# Patient Record
Sex: Male | Born: 1935 | Race: White | Hispanic: No | Smoking: Former smoker
Health system: Southern US, Community
[De-identification: ages and names within clinical notes are randomized; demographics above are authoritative.]

## PROBLEM LIST (undated history)

## (undated) DIAGNOSIS — C801 Malignant (primary) neoplasm, unspecified: Secondary | ICD-10-CM

## (undated) DIAGNOSIS — H3530 Unspecified macular degeneration: Secondary | ICD-10-CM

## (undated) DIAGNOSIS — R259 Unspecified abnormal involuntary movements: Secondary | ICD-10-CM

## (undated) DIAGNOSIS — K219 Gastro-esophageal reflux disease without esophagitis: Secondary | ICD-10-CM

## (undated) DIAGNOSIS — I10 Essential (primary) hypertension: Secondary | ICD-10-CM

## (undated) DIAGNOSIS — IMO0001 Reserved for inherently not codable concepts without codable children: Secondary | ICD-10-CM

## (undated) DIAGNOSIS — M1990 Unspecified osteoarthritis, unspecified site: Secondary | ICD-10-CM

## (undated) DIAGNOSIS — F40240 Claustrophobia: Secondary | ICD-10-CM

## (undated) DIAGNOSIS — K589 Irritable bowel syndrome without diarrhea: Secondary | ICD-10-CM

## (undated) HISTORY — DX: Unspecified osteoarthritis, unspecified site: M19.90

## (undated) HISTORY — DX: Essential (primary) hypertension: I10

## (undated) HISTORY — PX: JOINT REPLACEMENT: SHX530

## (undated) HISTORY — PX: NASAL SEPTUM SURGERY: SHX37

## (undated) HISTORY — PX: EYE SURGERY: SHX253

## (undated) HISTORY — DX: Malignant (primary) neoplasm, unspecified: C80.1

---

## 2005-06-05 ENCOUNTER — Ambulatory Visit: Payer: Self-pay | Admitting: Orthopaedic Surgery

## 2008-01-12 ENCOUNTER — Ambulatory Visit: Payer: Self-pay | Admitting: Gastroenterology

## 2008-01-26 ENCOUNTER — Ambulatory Visit: Payer: Self-pay | Admitting: Gastroenterology

## 2008-05-08 ENCOUNTER — Ambulatory Visit: Payer: Self-pay | Admitting: General Practice

## 2012-10-03 ENCOUNTER — Ambulatory Visit: Payer: Self-pay | Admitting: Oncology

## 2012-10-03 LAB — COMPREHENSIVE METABOLIC PANEL
Alkaline Phosphatase: 83 U/L (ref 50–136)
Anion Gap: 4 — ABNORMAL LOW (ref 7–16)
BUN: 28 mg/dL — ABNORMAL HIGH (ref 7–18)
Calcium, Total: 9 mg/dL (ref 8.5–10.1)
Chloride: 104 mmol/L (ref 98–107)
Co2: 30 mmol/L (ref 21–32)
Creatinine: 1.24 mg/dL (ref 0.60–1.30)
EGFR (African American): 57 — ABNORMAL LOW
EGFR (Non-African Amer.): 49 — ABNORMAL LOW
Potassium: 4.5 mmol/L (ref 3.5–5.1)
Sodium: 138 mmol/L (ref 136–145)
Total Protein: 7.3 g/dL (ref 6.4–8.2)

## 2012-10-03 LAB — CBC CANCER CENTER
Basophil #: 0 x10 3/mm (ref 0.0–0.1)
Basophil %: 1 %
Eosinophil %: 2.4 %
HCT: 39.5 % — ABNORMAL LOW (ref 40.0–52.0)
HGB: 13.9 g/dL (ref 13.0–18.0)
Lymphocyte #: 0.9 x10 3/mm — ABNORMAL LOW (ref 1.0–3.6)
Lymphocyte %: 19.8 %
MCH: 32.4 pg (ref 26.0–34.0)
MCHC: 35.3 g/dL (ref 32.0–36.0)
Monocyte #: 0.4 x10 3/mm (ref 0.2–1.0)
Monocyte %: 9.3 %
Neutrophil #: 3.1 x10 3/mm (ref 1.4–6.5)
Platelet: 178 x10 3/mm (ref 150–440)
RBC: 4.31 10*6/uL — ABNORMAL LOW (ref 4.40–5.90)
RDW: 13 % (ref 11.5–14.5)
WBC: 4.7 x10 3/mm (ref 3.8–10.6)

## 2012-10-07 ENCOUNTER — Ambulatory Visit: Payer: Self-pay | Admitting: Oncology

## 2012-10-10 ENCOUNTER — Ambulatory Visit: Payer: Self-pay | Admitting: Oncology

## 2012-11-07 ENCOUNTER — Ambulatory Visit: Payer: Self-pay | Admitting: Oncology

## 2012-11-11 LAB — CBC CANCER CENTER
Basophil #: 0 x10 3/mm (ref 0.0–0.1)
Basophil %: 0.7 %
Eosinophil #: 0.2 x10 3/mm (ref 0.0–0.7)
Lymphocyte #: 1 x10 3/mm (ref 1.0–3.6)
Lymphocyte %: 19.3 %
MCH: 31.3 pg (ref 26.0–34.0)
Monocyte #: 0.5 x10 3/mm (ref 0.2–1.0)
Monocyte %: 9.6 %
Platelet: 160 x10 3/mm (ref 150–440)
WBC: 5 x10 3/mm (ref 3.8–10.6)

## 2012-11-18 LAB — CBC CANCER CENTER
Basophil #: 0 x10 3/mm (ref 0.0–0.1)
Basophil %: 0.5 %
Eosinophil #: 0.1 x10 3/mm (ref 0.0–0.7)
HGB: 13.7 g/dL (ref 13.0–18.0)
MCH: 31.5 pg (ref 26.0–34.0)
MCV: 92 fL (ref 80–100)
Monocyte %: 9.4 %
Neutrophil %: 73.1 %
Platelet: 175 x10 3/mm (ref 150–440)
RBC: 4.37 10*6/uL — ABNORMAL LOW (ref 4.40–5.90)
RDW: 13.3 % (ref 11.5–14.5)

## 2012-12-02 LAB — CBC CANCER CENTER
Basophil #: 0 x10 3/mm (ref 0.0–0.1)
Basophil %: 0.5 %
Eosinophil %: 2.3 %
HGB: 13.7 g/dL (ref 13.0–18.0)
Lymphocyte #: 0.7 x10 3/mm — ABNORMAL LOW (ref 1.0–3.6)
MCHC: 33.9 g/dL (ref 32.0–36.0)
MCV: 92 fL (ref 80–100)
Monocyte #: 0.6 x10 3/mm (ref 0.2–1.0)
Monocyte %: 11.4 %
Neutrophil #: 3.8 x10 3/mm (ref 1.4–6.5)
Platelet: 188 x10 3/mm (ref 150–440)
RBC: 4.39 10*6/uL — ABNORMAL LOW (ref 4.40–5.90)
RDW: 12.9 % (ref 11.5–14.5)
WBC: 5.2 x10 3/mm (ref 3.8–10.6)

## 2012-12-07 ENCOUNTER — Ambulatory Visit: Payer: Self-pay | Admitting: Oncology

## 2012-12-09 LAB — CBC CANCER CENTER
Basophil #: 0 x10 3/mm (ref 0.0–0.1)
Basophil %: 0.4 %
Eosinophil #: 0.1 x10 3/mm (ref 0.0–0.7)
Lymphocyte #: 0.5 x10 3/mm — ABNORMAL LOW (ref 1.0–3.6)
MCH: 31.7 pg (ref 26.0–34.0)
MCHC: 34.5 g/dL (ref 32.0–36.0)
MCV: 92 fL (ref 80–100)
Monocyte #: 0.5 x10 3/mm (ref 0.2–1.0)
Monocyte %: 10 %
Neutrophil %: 76.4 %
Platelet: 189 x10 3/mm (ref 150–440)
RDW: 12.7 % (ref 11.5–14.5)
WBC: 4.9 x10 3/mm (ref 3.8–10.6)

## 2012-12-16 LAB — CBC CANCER CENTER
Basophil #: 0 x10 3/mm (ref 0.0–0.1)
Basophil %: 0.6 %
Eosinophil %: 3.2 %
HCT: 36.9 % — ABNORMAL LOW (ref 40.0–52.0)
Lymphocyte #: 0.4 x10 3/mm — ABNORMAL LOW (ref 1.0–3.6)
Lymphocyte %: 8.1 %
MCHC: 34.4 g/dL (ref 32.0–36.0)
MCV: 92 fL (ref 80–100)
Monocyte #: 0.5 x10 3/mm (ref 0.2–1.0)
Monocyte %: 10.8 %
RBC: 4.02 10*6/uL — ABNORMAL LOW (ref 4.40–5.90)
RDW: 12.7 % (ref 11.5–14.5)
WBC: 4.7 x10 3/mm (ref 3.8–10.6)

## 2012-12-21 ENCOUNTER — Ambulatory Visit: Payer: Self-pay | Admitting: Podiatry

## 2013-01-02 ENCOUNTER — Encounter: Payer: Self-pay | Admitting: *Deleted

## 2013-01-02 ENCOUNTER — Ambulatory Visit: Payer: Self-pay | Admitting: Podiatry

## 2013-01-07 ENCOUNTER — Ambulatory Visit: Payer: Self-pay | Admitting: Oncology

## 2013-01-09 ENCOUNTER — Ambulatory Visit (INDEPENDENT_AMBULATORY_CARE_PROVIDER_SITE_OTHER): Payer: Medicare HMO | Admitting: Podiatry

## 2013-01-09 ENCOUNTER — Encounter: Payer: Self-pay | Admitting: Podiatry

## 2013-01-09 VITALS — BP 151/77 | HR 64 | Resp 16 | Ht 76.0 in | Wt 196.0 lb

## 2013-01-09 DIAGNOSIS — M79609 Pain in unspecified limb: Secondary | ICD-10-CM

## 2013-01-09 DIAGNOSIS — B351 Tinea unguium: Secondary | ICD-10-CM

## 2013-01-09 NOTE — Progress Notes (Signed)
  Subjective:    Patient ID: Arthur Blair, male    DOB: Aug 12, 1915, 79 y.o.   MRN: [id number]  HPI    Review of Systems  Constitutional: Negative.   HENT: Negative.   Eyes: Negative.   Respiratory: Negative.   Cardiovascular: Negative.   Gastrointestinal: Negative.   Endocrine: Negative.   Genitourinary: Negative.   Allergic/Immunologic: Negative.   Neurological: Negative.   Hematological: Negative.   Psychiatric/Behavioral: Negative.        Objective:   Physical Exam        Assessment & Plan:

## 2013-01-09 NOTE — Progress Notes (Signed)
Arthur Blair presents today for routine nail debridement. States that her toe in his toenails are bothering him in the rubbing of shoes.  Objective: Vital signs are stable he is alert and oriented x3. Pulses remain palpable bilateral lower extremity. Hammertoe deformities are noted bilateral. Nails are thick yellow dystrophic clinically mycotic and painful on palpation.  Assessment: Pain in limb secondary to onychomycosis 1 through 5 bilateral.  Plan: Debridement of nails 1 through 5 bilateral is a covered service secondary to pain. Followup with him in 3 months.

## 2013-04-03 ENCOUNTER — Encounter: Payer: Self-pay | Admitting: Podiatry

## 2013-04-03 ENCOUNTER — Ambulatory Visit (INDEPENDENT_AMBULATORY_CARE_PROVIDER_SITE_OTHER): Payer: Medicare HMO | Admitting: Podiatry

## 2013-04-03 VITALS — BP 112/55 | HR 71 | Resp 16 | Ht 76.0 in | Wt 196.0 lb

## 2013-04-03 DIAGNOSIS — M79609 Pain in unspecified limb: Secondary | ICD-10-CM

## 2013-04-03 DIAGNOSIS — B351 Tinea unguium: Secondary | ICD-10-CM

## 2013-04-03 NOTE — Progress Notes (Signed)
Arthur Blair presents today with a chief complaint of painful toenails one through 5 bilateral.  Objective: Vital signs are stable he is alert and oriented x3. Nails are thick yellow dystrophic onychomycotic painful palpation as well as debridement. Pulses are palpable bilateral.  Assessment: Pain in limb secondary to onychomycosis 1 through 5 bilateral.  Plan: Debridement nails thickness and length as cover service entered pain. Followup with him in 2-3 months.

## 2013-04-17 ENCOUNTER — Ambulatory Visit: Payer: Self-pay | Admitting: Otolaryngology

## 2013-04-17 LAB — POTASSIUM: Potassium: 4.3 mmol/L (ref 3.5–5.1)

## 2013-04-19 ENCOUNTER — Ambulatory Visit: Payer: Self-pay | Admitting: Otolaryngology

## 2013-04-21 LAB — PATHOLOGY REPORT

## 2013-04-26 ENCOUNTER — Ambulatory Visit: Payer: Self-pay | Admitting: Oncology

## 2013-05-07 ENCOUNTER — Ambulatory Visit: Payer: Self-pay | Admitting: Oncology

## 2013-07-03 ENCOUNTER — Ambulatory Visit (INDEPENDENT_AMBULATORY_CARE_PROVIDER_SITE_OTHER): Payer: Medicare HMO | Admitting: Podiatry

## 2013-07-03 ENCOUNTER — Encounter: Payer: Self-pay | Admitting: Podiatry

## 2013-07-03 VITALS — BP 118/82 | HR 88 | Resp 18

## 2013-07-03 DIAGNOSIS — M722 Plantar fascial fibromatosis: Secondary | ICD-10-CM

## 2013-07-03 DIAGNOSIS — B351 Tinea unguium: Secondary | ICD-10-CM

## 2013-07-03 DIAGNOSIS — M79609 Pain in unspecified limb: Secondary | ICD-10-CM

## 2013-07-03 NOTE — Progress Notes (Signed)
He presents today with a chief complaint of painfully elongated toenails one through 5 bilateral. She's also complaining of pain to his right heel again.  Objective: Vital signs are stable he is alert 3. His nails are thick yellow dystrophic onychomycotic and painful palpation. Assessment pain on palpation medial continued tubercle of the right heel.  Assessment: Plantar fasciitis right heel pain in limb secondary to onychomycosis 1 through 5 bilateral.  Plan: Debridement of nails 1 through 5 bilateral covered service secondary to pain injection right heel with cortisone.

## 2013-09-14 ENCOUNTER — Ambulatory Visit: Payer: Self-pay | Admitting: Oncology

## 2013-10-02 ENCOUNTER — Ambulatory Visit (INDEPENDENT_AMBULATORY_CARE_PROVIDER_SITE_OTHER): Payer: Medicare HMO | Admitting: Podiatry

## 2013-10-02 ENCOUNTER — Encounter: Payer: Self-pay | Admitting: Podiatry

## 2013-10-02 DIAGNOSIS — M79676 Pain in unspecified toe(s): Secondary | ICD-10-CM

## 2013-10-02 DIAGNOSIS — B351 Tinea unguium: Secondary | ICD-10-CM

## 2013-10-02 DIAGNOSIS — M79609 Pain in unspecified limb: Secondary | ICD-10-CM

## 2013-10-02 NOTE — Progress Notes (Signed)
He presents today with a chief complaint of painful ingrown and elongated nails 1 through 5 bilateral. ° °Objective: Pulses are palpable bilateral. Nails are thick yellow dystrophic onychomycotic and painful palpation. ° °Assessment: Pain in limb secondary to onychomycosis 1 through 5 bilateral. ° °Plan: Debridement of nails 1 through 5 bilateral covered service secondary to pain. °

## 2014-01-01 ENCOUNTER — Ambulatory Visit (INDEPENDENT_AMBULATORY_CARE_PROVIDER_SITE_OTHER): Payer: Medicare HMO | Admitting: Podiatry

## 2014-01-01 ENCOUNTER — Encounter: Payer: Self-pay | Admitting: Podiatry

## 2014-01-01 DIAGNOSIS — B351 Tinea unguium: Secondary | ICD-10-CM

## 2014-01-01 DIAGNOSIS — M79676 Pain in unspecified toe(s): Secondary | ICD-10-CM

## 2014-01-01 NOTE — Progress Notes (Signed)
Presents today chief complaint of painful elongated toenails.  Objective: Pulses are palpable bilateral nails are thick, yellow dystrophic onychomycosis and painful palpation.   Assessment: Onychomycosis with pain in limb.  Plan: Treatment of nails in thickness and length as covered service secondary to pain.  

## 2014-03-30 ENCOUNTER — Ambulatory Visit: Payer: Self-pay | Admitting: Oncology

## 2014-04-02 ENCOUNTER — Ambulatory Visit: Payer: Medicare HMO | Admitting: Podiatry

## 2014-04-02 ENCOUNTER — Ambulatory Visit (INDEPENDENT_AMBULATORY_CARE_PROVIDER_SITE_OTHER): Payer: Medicare Other | Admitting: Podiatry

## 2014-04-02 DIAGNOSIS — B351 Tinea unguium: Secondary | ICD-10-CM | POA: Diagnosis not present

## 2014-04-02 DIAGNOSIS — M79676 Pain in unspecified toe(s): Secondary | ICD-10-CM

## 2014-04-02 NOTE — Progress Notes (Signed)
He presents today with a chief complaint of painful ingrown and elongated nails 1 through 5 bilateral.  Objective: Pulses are palpable bilateral. Nails are thick yellow dystrophic onychomycotic and painful palpation.  Assessment: Pain in limb secondary to onychomycosis 1 through 5 bilateral.  Plan: Debridement of nails 1 through 5 bilateral covered service secondary to pain.

## 2014-04-09 ENCOUNTER — Ambulatory Visit: Payer: Self-pay | Admitting: Oncology

## 2014-05-11 DIAGNOSIS — Z8546 Personal history of malignant neoplasm of prostate: Secondary | ICD-10-CM | POA: Insufficient documentation

## 2014-05-11 DIAGNOSIS — Z85810 Personal history of malignant neoplasm of tongue: Secondary | ICD-10-CM | POA: Insufficient documentation

## 2014-06-29 NOTE — Consult Note (Signed)
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   Referred by Dr. Oliva Bustard.  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  Pathology Report pathology report reviewed   Imaging Report pET/CT scan reviewed   Referral Report clinical notes reviewed   Planned Treatment Regimen IMRT radiation therapy   HPI   patient is a pleasant 81 year old male who presented with you believe they chronic ulcerative irritation from dentures on his left lateral tongue. He was seen oral surgeon who performed a biopsy showing chronic dysplasia of the surface epithelium of the tongue within invasive squamous cell carcinoma component which had positive margins. Based on his advanced age and overall excellent performance status he was seen by medical oncology and now referred to radiation oncology for consideration of definitive treatment. Patient has declined any further surgery. He had a PET/CT scanshowing small amount of increased hypermetabolic activity in the posterior aspect of the tongue. There is also incidental findings of nodular area in crease uptake in the thyroid bed. No evidence of hypermetabolic activity in cervical nodes was appreciated. Patient is eating well having no significant oral pain except when he comes in contact with a left oral tongue.  Past Hx:    irregular heart beat:    Hypertension:    prostate cancer:    cataract surgery with implants:    sinus cavity enlargement:    deviated septum repair:    lt hip rplacement:    tongue biopsy:   Past, Family and Social History:  Past Medical History positive   Cardiovascular atrial fibrillation; hypertension; iirregular heartbeat   Genitourinary history of prostate cancer   Past Surgical History left hip replacement, deviated septum  repaircataract surgery   Family History noncontributory   Social History positive   Social History Comments a 40-pack-year smoking history quit smoking 40 years prior no EtOH abuse history   Additional Past Medical and Surgical History seen by himself today   Allergies:   Sulfa drugs: Hives  Home Meds:  Home Medications: Medication Instructions Status  acetaminophen-HYDROcodone 325 mg-5 mg oral tablet 1 tab(s) orally every 6 hours, As Needed - for Pain Active  aspirin 81 mg oral delayed release tablet 1 tab(s) orally once a day Active  Complete A-Z Therapeutic Multiple Vitamins with Minerals oral tablet 1 tab(s) orally once a day Active  metoprolol 12.5 milligram(s) orally once a day Active  Calcium 600+D 600 mg-200 units oral tablet 1 tab(s) orally once a day Active  fluticasone nasal 50 mcg/inh nasal spray 2 spray(s) nasal once a day Active  Probiotic Formula - oral capsule 1 cap(s) orally once a day Active  traMADol 50 mg oral tablet 1 tab(s) orally every 4 hours, As Needed Active  furosemide 20 mg oral tablet 1 tab(s) orally once a day Active  magnesium oxide 500 mg oral tablet 1 tab(s) orally once a day, As Needed Active   Review of Systems:  General negative   Performance Status (ECOG) 0   Skin negative   Breast negative   Ophthalmologic negative   ENMT see HPI   Respiratory and Thorax negative   Cardiovascular see HPI   Gastrointestinal negative   Genitourinary negative   Musculoskeletal negative   Neurological negative   Psychiatric negative   Hematology/Lymphatics negative   Endocrine negative   Allergic/Immunologic  negative   Nursing Notes:  Nursing Vital Signs and Chemo Nursing Nursing Notes: *CC Vital Signs Flowsheet:   07-Aug-14 11:15  Temp Temperature 96.6  Pulse Pulse 65  Respirations Respirations 21  SBP SBP 165  DBP DBP 79  Current Weight (kg) (kg) 96  Height (cm) centimeters 94.9  BSA (m2) 1.3   Physical Exam:   General/Skin/HEENT:  General normal   Skin normal   Eyes normal   Additional PE well-developed elderly male who looks much and stated age. Oral cavity shows some slight residual scarring in the left lateral tongue no discrete mass or nodularity is noted either by inspection or palpation. Indirect mirror examination shows upper airway clear vallecula and base of tongue within normal limits. Corresponding to area of hypermetabolic activity in the soft palate do not see any evidence of lesions in that area. Neck is clear without evidence of some digastric cervical or supraclavicular adenopathy. Lungs are clear to A&P cardiac examination shows irregular rate and rhythm. Abdomen is benign.   Breasts/Resp/CV/GI/GU:  Respiratory and Thorax normal   Cardiovascular normal   Gastrointestinal normal   Genitourinary normal   MS/Neuro/Psych/Lymph:  Musculoskeletal normal   Neurological normal   Lymphatics normal   Other Results:  Radiology Results: LabUnknown:    04-Aug-14 14:48, PET/CT Scan Head/Neck CA Initial Staging  PACS Image   Nuclear Med:  PET/CT Scan Head/Neck CA Initial Staging   REASON FOR EXAM:    head and neck CA  COMMENTS:       PROCEDURE: PET - PET/CT INIT STAG HEAD/NECK CA  - Oct 10 2012  2:48PM     RESULT: The patient is undergoing initial staging of lung malignancy. The   patient has a history of prostate malignancy in 1994 treated with   radiation therapy. The patient's fasting blood glucose level was 88 mg/dL.    The patient received 12.6 mCi of F-18 labeled FDG at 12:55 p.m. Scanning   began at 2:01 p.m. with delayed imaging beginning at 2:32 p.m. A   noncontrast CT scan was performed at the same sitting for coregistration   and attenuation correction.    A small amount of increased uptake is noted along the posterior aspect of     the tongue. This exhibits maximal SUV of 3.7 with a mean of 2.1 on the   initial images and 4.2 with a mean of 2.4 on delayed  images. There is   increased uptake along the hard palate which exhibits a maximal SUV of   4.1 and a mean of 2.4 on the immediate images and 5.3 maximally with a   mean of 2.9 on delayed images.     Uptake within the soft tissues adjacent to the thyroid gland is seen   bilaterally. On the right a focus of increased uptake along the anterior   aspect of the thyroid lobe exhibits a maximal SUV of 4.7 with a mean of   2.9. On the left a focus just inferior to the lower pole of the left   thyroid lobe exhibits maximal SUV of 6 with a mean of 3.4. A small focus   of low level increased uptake associated with the medial aspect of the   left clavicle exhibits maximal SUV of 2.9 with a mean of 1.6. Within the   mediastinum no abnormal uptake is demonstrated. No abnormal hilar uptake   is demonstrated. No abnormal chest wall uptake is demonstrated.  Below the hemidiaphragms normal expected urinary tract uptake  is   demonstrated. There is a small focus of increased uptake associated with   the prosthetic left hip. On the CT images within the thorax in the   posterior costophrenic gutter there is a mixed calcified soft tissue   density mass which does not exhibit increased uptake of the   radiopharmaceutical. There is no pleural nor pericardial effusion. There   is pleural calcification on the left posteriorly. Within the abdomen   there is a large parapelvic cyst in the right kidney. There is   approximately 1.3 cm diameter hyperdense focus exophytic from the   posterior aspect of the right kidney most compatible with a hyperdense   cyst. The abdominal aorta exhibits mild failure to taper of its caliber   with mild dilation of the common iliac arteries measured approximately 2   cm in greatest dimension. There are no adrenal masses. There are punctate   calcifications in the normal sized spleen. Unopacified loops of small and   large bowel exhibit no acute abnormality.  IMPRESSION:   1. There is  a small amount of increased uptake within the posterior   aspect of the tongue.  2. Nodular areas of increased uptake in the thyroid bed but not clearly   within the thyroid gland are noted and may reflect metastatic lymph nodes.  3. There is no abnormal uptake in the mediastinum or hilarregions.  4. Below the hemidiaphragms there are no suspicious areas of increased   uptake.  5. Mildly increased uptake associated with the hard palate as well as of   the medial aspect of the left clavicle are nonspecific. Plain films of   the clavicle may be useful. Direct visualization of the hard palate also   would be useful.  6. There are findings on the CT scan as described.     Dictation Site: 1    Verified By: DAVID A. Martinique, M.D., MD   Relevent Results:   Relevant Scans and Labs PET/CT scan is reviewed   Assessment and Plan: Impression:   clinical stage I squamous cell carcinoma left lateral tongue in 81 year old male. Plan:   based on the patient's age and wishes not to go through any further surgery believe we can accomplish curative radiation therapy with IMRT treatment planning and delivery. I will plan on delivering 6400 cGy to his left lateral tongue and first echelon lymph nodes in the subdigastric and upper cervical chain using IMRT does painting technique. Risks and benefits of treatment including decreased taste, possible xerostomia, increasing dysphasia second oral mucositis skin reaction and alteration blood counts were all explained in detail to the patient. Patient seems to [place] treatment plan well. I have set him up for CT simulation next week.  I would like to take this opportunity to thank you for allowing me to continue to participate in this patient's care.  CC Referral:  cc: Dr. Emily Filbert   Electronic Signatures: Baruch Gouty, Roda Shutters (MD)  (Signed 07-Aug-14 12:14)  Authored: HPI, Diagnosis, Past Hx, PFSH, Allergies, Home Meds, ROS, Nursing Notes, Physical Exam,  Other Results, Relevent Results, Encounter Assessment and Plan, CC Referring Physician   Last Updated: 07-Aug-14 12:14 by Armstead Peaks (MD)

## 2014-06-30 NOTE — Op Note (Signed)
PATIENT NAME:  Arthur Blair, Arthur Blair MR#:  [id number] DATE OF BIRTH:  Jun 05, 1915  DATE OF PROCEDURE:  04/19/2013  PREOPERATIVE DIAGNOSES: Dysplasia of the left oral tongue with history of left oral tongue carcinoma, status post radiation therapy.   POSTOPERATIVE DIAGNOSIS: Dysplasia of the left oral tongue with history of left oral tongue carcinoma, status post radiation therapy  PROCEDURE: Left partial glossectomy (2.5 x 6 cm excision with intermediate closure).   SURGEON: Malon Kindle MD.   ANESTHESIA: General endotracheal.   INDICATIONS: This 81 year old male with history of a left oral tongue cancer, status post radiation therapy. He has a persistent ulcerative lesion. A biopsy showed significant dysplasia. 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   FINDINGS: The central lesion was almost A centimeter in size with ulceration. There were surrounding areas of leukoplakia. Getting around the entire area of mucosal abnormality resulted in a defect of 6 x 2.5 cm.   COMPLICATIONS: None.   DESCRIPTION OF PROCEDURE: After obtaining informed consent, the patient was taken to the operating room and placed in the supine position. After induction of general endotracheal anesthesia, the patient was turned 90 degrees. The head was draped with the eyes protected. A retractor was used to open the mouth. A 2-0 silk suture was passed through the tip of the tongue to be used as a retractor and the tongue retracted outwards. The area was inspected and a marking pen used to outline an area encompassing the central ulcerative lesion as well as surrounding areas of leukoplakia with a margin of normal mucosa approximately 4 to 5 mm away from any abnormal-appearing mucosa. The area was then injected with 1% lidocaine with epinephrine 1:200,000. A 15 blade was used to make the incision around the area. Scissors were then used to excise the lesion with at least a 5 to 6 mm depth in the more central aspect of the lesion  taking care to make sure no abnormal tissue was at the margins. Bleeding was controlled with the Bovie and the wound closed in layers with 4-0 Vicryl suture for the deep closure and 4-0 chromic gut suture in a running lock stitch for the mucosal closure. The patient was then returned to the anesthesiologist for awakening. He was awakened and taken to the recovery room in good condition postoperatively. Blood loss was minimal.   ____________________________ Sammuel Hines. Richardson Landry, MD psb:aw D: 04/19/2013 08:46:28 ET T: 04/19/2013 09:10:59 ET JOB#: [id number]  cc: Sammuel Hines. Richardson Landry, MD, <Dictator> [name] MD ELECTRONICALLY SIGNED 05/03/2013 10:19

## 2014-07-02 ENCOUNTER — Ambulatory Visit (INDEPENDENT_AMBULATORY_CARE_PROVIDER_SITE_OTHER): Payer: Medicare Other | Admitting: Podiatry

## 2014-07-02 DIAGNOSIS — M79676 Pain in unspecified toe(s): Secondary | ICD-10-CM

## 2014-07-02 DIAGNOSIS — B351 Tinea unguium: Secondary | ICD-10-CM

## 2014-07-02 NOTE — Progress Notes (Signed)
He presents today with a chief complaint of painful ingrown and elongated nails 1 through 5 bilateral.  Objective: Pulses are palpable bilateral. Nails are thick yellow dystrophic onychomycotic and painful palpation.  Assessment: Pain in limb secondary to onychomycosis 1 through 5 bilateral.  Plan: Debridement of nails 1 through 5 bilateral covered service secondary to pain.

## 2014-08-02 ENCOUNTER — Other Ambulatory Visit: Payer: Self-pay | Admitting: Internal Medicine

## 2014-08-02 DIAGNOSIS — R55 Syncope and collapse: Secondary | ICD-10-CM

## 2014-08-03 ENCOUNTER — Ambulatory Visit
Admission: RE | Admit: 2014-08-03 | Discharge: 2014-08-03 | Disposition: A | Discharge status: Home / Self Care | Payer: Medicare Other | Source: Ambulatory Visit | Attending: Internal Medicine | Admitting: Internal Medicine

## 2014-08-03 DIAGNOSIS — I6523 Occlusion and stenosis of bilateral carotid arteries: Secondary | ICD-10-CM | POA: Diagnosis not present

## 2014-08-03 DIAGNOSIS — R55 Syncope and collapse: Secondary | ICD-10-CM | POA: Diagnosis present

## 2014-09-05 DIAGNOSIS — I493 Ventricular premature depolarization: Secondary | ICD-10-CM | POA: Insufficient documentation

## 2014-10-29 ENCOUNTER — Ambulatory Visit (INDEPENDENT_AMBULATORY_CARE_PROVIDER_SITE_OTHER): Payer: Medicare Other | Admitting: Podiatry

## 2014-10-29 DIAGNOSIS — B351 Tinea unguium: Secondary | ICD-10-CM | POA: Diagnosis not present

## 2014-10-29 DIAGNOSIS — M79676 Pain in unspecified toe(s): Secondary | ICD-10-CM

## 2014-10-29 NOTE — Progress Notes (Signed)
He presents today with a chief complaint of painful ingrown and elongated nails 1 through 5 bilateral.  Objective: Pulses are palpable bilateral. Nails are thick yellow dystrophic onychomycotic and painful palpation. Dry xerotic skin.  Assessment: Pain in limb secondary to onychomycosis 1 through 5 bilateral. Dry xerotic skin.  Plan: Debridement of nails 1 through 5 bilateral covered service secondary to pain. Recommended over-the-counter creams such as Eucerin.  Roselind Messier DPM

## 2015-01-28 ENCOUNTER — Ambulatory Visit: Payer: Medicare Other

## 2015-01-29 ENCOUNTER — Encounter: Payer: Self-pay | Admitting: Sports Medicine

## 2015-01-29 ENCOUNTER — Ambulatory Visit (INDEPENDENT_AMBULATORY_CARE_PROVIDER_SITE_OTHER): Payer: Medicare Other | Admitting: Sports Medicine

## 2015-01-29 DIAGNOSIS — B351 Tinea unguium: Secondary | ICD-10-CM | POA: Diagnosis not present

## 2015-01-29 DIAGNOSIS — M79676 Pain in unspecified toe(s): Secondary | ICD-10-CM

## 2015-01-29 NOTE — Progress Notes (Signed)
Patient ID: Arthur Blair, male   DOB: February 16, 1916, 81 y.o.   MRN: [id number] Subjective: Arthur Blair is a 81 y.o. male patient seen today in office with complaint of painful thickened and elongated toenails; unable to trim. Patient denies history of Diabetes, Neuropathy, or Vascular disease. Patient has no other pedal complaints at this time.   There are no active problems to display for this patient.  Current Outpatient Prescriptions on File Prior to Visit  Medication Sig Dispense Refill  . aspirin 81 MG tablet Take 81 mg by mouth daily.    . fluticasone (FLONASE) 50 MCG/ACT nasal spray     . furosemide (LASIX) 20 MG tablet     . HYDROcodone-acetaminophen (NORCO/VICODIN) 5-325 MG per tablet     . magnesium gluconate (MAGONATE) 500 MG tablet Take 500 mg by mouth daily.    . metoprolol succinate (TOPROL-XL) 25 MG 24 hr tablet     . Multiple Vitamin (MULTIVITAMIN) tablet Take 1 tablet by mouth daily.    . sucralfate (CARAFATE) 1 G tablet     . traMADol (ULTRAM) 50 MG tablet Take 50 mg by mouth as needed for pain.     No current facility-administered medications on file prior to visit.   Allergies  Allergen Reactions  . Sulfa Antibiotics Nausea And Vomiting     Objective: Physical Exam  General: Well developed, nourished, no acute distress, awake, alert and oriented x 3. Rolling Walker assisted gait  Vascular: Dorsalis pedis artery 1/4 bilateral, Posterior tibial artery 1/4 bilateral, skin temperature warm to warm proximal to distal bilateral lower extremities, + varicosities, scant pedal hair present bilateral.  Neurological: Gross sensation present via light touch bilateral.   Dermatological: Skin is warm, dry, and supple bilateral, Nails 1-10 are tender, long, thick, and discolored with mild subungal debris, no webspace macerations present bilateral, no open lesions present bilateral, no callus/corns/hyperkeratotic tissue present bilateral. No signs of infection  bilateral.  Musculoskeletal: No gross boney deformities noted bilateral. Muscular strength within normal limits without pain or limitation on range of motion. No pain with calf compression bilateral.  Assessment and Plan:  Problem List Items Addressed This Visit    None    Visit Diagnoses    Dermatophytosis of nail    -  Primary    Pain of toe, unspecified laterality          -Examined patient.  -Discussed treatment options for painful mycotic nails. -Mechanically debrided and reduced mycotic nails with sterile nail nipper and dremel nail file without incident. -Recommend continue with good supportive shoes daily and ambulation with rolling walker for stability. -Patient to return in 3 months for follow up evaluation or sooner if symptoms worsen.  Landis Martins, DPM

## 2015-04-01 ENCOUNTER — Encounter
Admission: RE | Admit: 2015-04-01 | Discharge: 2015-04-01 | Disposition: A | Discharge status: Home / Self Care | Payer: Medicare Other | Source: Ambulatory Visit | Attending: Otolaryngology | Admitting: Otolaryngology

## 2015-04-01 DIAGNOSIS — M1991 Primary osteoarthritis, unspecified site: Secondary | ICD-10-CM | POA: Diagnosis not present

## 2015-04-01 DIAGNOSIS — Z9889 Other specified postprocedural states: Secondary | ICD-10-CM | POA: Diagnosis not present

## 2015-04-01 DIAGNOSIS — Z79899 Other long term (current) drug therapy: Secondary | ICD-10-CM | POA: Diagnosis not present

## 2015-04-01 DIAGNOSIS — D3702 Neoplasm of uncertain behavior of tongue: Secondary | ICD-10-CM | POA: Diagnosis present

## 2015-04-01 DIAGNOSIS — H409 Unspecified glaucoma: Secondary | ICD-10-CM | POA: Diagnosis not present

## 2015-04-01 DIAGNOSIS — Z87891 Personal history of nicotine dependence: Secondary | ICD-10-CM | POA: Diagnosis not present

## 2015-04-01 DIAGNOSIS — R1310 Dysphagia, unspecified: Secondary | ICD-10-CM | POA: Diagnosis not present

## 2015-04-01 DIAGNOSIS — Z882 Allergy status to sulfonamides status: Secondary | ICD-10-CM | POA: Diagnosis not present

## 2015-04-01 DIAGNOSIS — Z7982 Long term (current) use of aspirin: Secondary | ICD-10-CM | POA: Diagnosis not present

## 2015-04-01 DIAGNOSIS — Z96649 Presence of unspecified artificial hip joint: Secondary | ICD-10-CM | POA: Diagnosis not present

## 2015-04-01 DIAGNOSIS — M810 Age-related osteoporosis without current pathological fracture: Secondary | ICD-10-CM | POA: Diagnosis not present

## 2015-04-01 DIAGNOSIS — I10 Essential (primary) hypertension: Secondary | ICD-10-CM | POA: Diagnosis not present

## 2015-04-01 DIAGNOSIS — Z7951 Long term (current) use of inhaled steroids: Secondary | ICD-10-CM | POA: Diagnosis not present

## 2015-04-01 DIAGNOSIS — C029 Malignant neoplasm of tongue, unspecified: Secondary | ICD-10-CM | POA: Diagnosis not present

## 2015-04-01 HISTORY — DX: Gastro-esophageal reflux disease without esophagitis: K21.9

## 2015-04-01 HISTORY — DX: Unspecified macular degeneration: H35.30

## 2015-04-01 HISTORY — DX: Irritable bowel syndrome without diarrhea: K58.9

## 2015-04-01 HISTORY — DX: Reserved for inherently not codable concepts without codable children: IMO0001

## 2015-04-01 HISTORY — DX: Claustrophobia: F40.240

## 2015-04-01 HISTORY — DX: Unspecified abnormal involuntary movements: R25.9

## 2015-04-01 LAB — BASIC METABOLIC PANEL
Anion gap: 6 (ref 5–15)
BUN: 32 mg/dL — ABNORMAL HIGH (ref 6–20)
CALCIUM: 9 mg/dL (ref 8.9–10.3)
CO2: 29 mmol/L (ref 22–32)
Chloride: 104 mmol/L (ref 101–111)
Creatinine, Ser: 1.06 mg/dL (ref 0.61–1.24)
GFR, EST NON AFRICAN AMERICAN: 57 mL/min — AB (ref >60)
Glucose, Bld: 88 mg/dL (ref 65–99)
Potassium: 4.7 mmol/L (ref 3.5–5.1)
Sodium: 139 mmol/L (ref 135–145)

## 2015-04-01 LAB — CBC
HCT: 36.3 % — ABNORMAL LOW (ref 40.0–52.0)
Hemoglobin: 12.2 g/dL — ABNORMAL LOW (ref 13.0–18.0)
MCH: 29.7 pg (ref 26.0–34.0)
MCHC: 33.6 g/dL (ref 32.0–36.0)
MCV: 88.4 fL (ref 80.0–100.0)
PLATELETS: 169 10*3/uL (ref 150–440)
RBC: 4.11 MIL/uL — ABNORMAL LOW (ref 4.40–5.90)
RDW: 13.9 % (ref 11.5–14.5)
WBC: 5 10*3/uL (ref 3.8–10.6)

## 2015-04-01 NOTE — Pre-Procedure Instructions (Signed)
EKG called to Dr. Andree Elk by Janett Billow and medical clearance requested.

## 2015-04-01 NOTE — Patient Instructions (Signed)
  Your procedure is scheduled on: April 03, 2015 (Wednesday) Report to Day Surgery.([facility])Second Floor To find out your arrival time please call [phone] between 1PM - 3PM on April 02, 2015 (Tuesday).  Remember: Instructions that are not followed completely may result in serious medical risk, up to and including death, or upon the discretion of your surgeon and anesthesiologist your surgery may need to be rescheduled.    __x__ 1. Do not eat food or drink liquids after midnight. No gum chewing or hard candies.     __x__ 2. No Alcohol for 24 hours before or after surgery.   ____ 3. Bring all medications with you on the day of surgery if instructed.    __x__ 4. Notify your doctor if there is any change in your medical condition     (cold, fever, infections).     Do not wear jewelry, make-up, hairpins, clips or nail polish.  Do not wear lotions, powders, or perfumes. You may wear deodorant.  Do not shave 48 hours prior to surgery. Men may shave face and neck.  Do not bring valuables to the hospital.    [facility] is not responsible for any belongings or valuables.               Contacts, dentures or bridgework may not be worn into surgery.  Leave your suitcase in the car. After surgery it may be brought to your room.  For patients admitted to the hospital, discharge time is determined by your                treatment team.   Patients discharged the day of surgery will not be allowed to drive home.   Please read over the following fact sheets that you were given:   Surgical Site Infection Prevention   _x___ Take these medicines the morning of surgery with A SIP OF WATER:    1. Metoprolol   2.Prilosec  ____ Fleet Enema (as directed)   ____ Use CHG Soap as directed  ____ Use inhalers on the day of surgery  ____ Stop metformin 2 days prior to surgery    ____ Take 1/2 of usual insulin dose the night before surgery and none on the morning of surgery.   _x__ Stop  Coumadin/Plavix/aspirin on (STOP ASPIRIN NOW)  _x___ Stop Anti-inflammatories on (NO NSAIDS )   _x__ Stop supplements until after surgery. (STOP PROBIOTIC NOW)   ____ Bring C-Pap to the hospital.

## 2015-04-01 NOTE — Pre-Procedure Instructions (Signed)
Medical clearance called and faxed to Dr. Richardson Landry office (spoke to [place]).

## 2015-04-02 ENCOUNTER — Inpatient Hospital Stay: Admission: RE | Admit: 2015-04-02 | Payer: Medicare Other | Source: Ambulatory Visit

## 2015-04-02 NOTE — Pre-Procedure Instructions (Signed)
Arthur [name] [name] [name] LOW RISK

## 2015-04-03 ENCOUNTER — Encounter
Admission: RE | Disposition: A | Discharge status: Home / Self Care | Payer: Self-pay | Source: Ambulatory Visit | Attending: Otolaryngology

## 2015-04-03 ENCOUNTER — Ambulatory Visit: Payer: Medicare Other | Admitting: Registered Nurse

## 2015-04-03 ENCOUNTER — Ambulatory Visit
Admission: RE | Admit: 2015-04-03 | Discharge: 2015-04-04 | Disposition: A | Discharge status: Home / Self Care | Payer: Medicare Other | Source: Ambulatory Visit | Attending: Otolaryngology | Admitting: Otolaryngology

## 2015-04-03 ENCOUNTER — Encounter: Payer: Self-pay | Admitting: *Deleted

## 2015-04-03 DIAGNOSIS — C029 Malignant neoplasm of tongue, unspecified: Secondary | ICD-10-CM | POA: Diagnosis not present

## 2015-04-03 DIAGNOSIS — M810 Age-related osteoporosis without current pathological fracture: Secondary | ICD-10-CM | POA: Insufficient documentation

## 2015-04-03 DIAGNOSIS — M1991 Primary osteoarthritis, unspecified site: Secondary | ICD-10-CM | POA: Insufficient documentation

## 2015-04-03 DIAGNOSIS — Z96649 Presence of unspecified artificial hip joint: Secondary | ICD-10-CM | POA: Insufficient documentation

## 2015-04-03 DIAGNOSIS — Z79899 Other long term (current) drug therapy: Secondary | ICD-10-CM | POA: Insufficient documentation

## 2015-04-03 DIAGNOSIS — Z87891 Personal history of nicotine dependence: Secondary | ICD-10-CM | POA: Insufficient documentation

## 2015-04-03 DIAGNOSIS — Z7951 Long term (current) use of inhaled steroids: Secondary | ICD-10-CM | POA: Insufficient documentation

## 2015-04-03 DIAGNOSIS — C01 Malignant neoplasm of base of tongue: Secondary | ICD-10-CM | POA: Diagnosis present

## 2015-04-03 DIAGNOSIS — Z7982 Long term (current) use of aspirin: Secondary | ICD-10-CM | POA: Insufficient documentation

## 2015-04-03 DIAGNOSIS — Z882 Allergy status to sulfonamides status: Secondary | ICD-10-CM | POA: Insufficient documentation

## 2015-04-03 DIAGNOSIS — I10 Essential (primary) hypertension: Secondary | ICD-10-CM | POA: Diagnosis present

## 2015-04-03 DIAGNOSIS — Z9889 Other specified postprocedural states: Secondary | ICD-10-CM | POA: Insufficient documentation

## 2015-04-03 DIAGNOSIS — H409 Unspecified glaucoma: Secondary | ICD-10-CM | POA: Insufficient documentation

## 2015-04-03 DIAGNOSIS — R1310 Dysphagia, unspecified: Secondary | ICD-10-CM | POA: Insufficient documentation

## 2015-04-03 HISTORY — PX: EXCISION OF TONGUE LESION: SHX6434

## 2015-04-03 SURGERY — EXCISION, LESION, TONGUE
Anesthesia: General | Wound class: Clean Contaminated

## 2015-04-03 MED ORDER — FENTANYL CITRATE (PF) 100 MCG/2ML IJ SOLN
25.0000 ug | INTRAMUSCULAR | Status: DC | PRN
  Administered 2015-04-03 (×4): 25 ug via INTRAVENOUS

## 2015-04-03 MED ORDER — HYDRALAZINE HCL 20 MG/ML IJ SOLN
10.0000 mg | Freq: Once | INTRAMUSCULAR | Status: AC
  Administered 2015-04-03: 5 mg via INTRAVENOUS

## 2015-04-03 MED ORDER — ONDANSETRON HCL 4 MG/2ML IJ SOLN
4.0000 mg | INTRAMUSCULAR | Status: DC | PRN
  Administered 2015-04-03: 4 mg via INTRAVENOUS
  Filled 2015-04-03: qty 2

## 2015-04-03 MED ORDER — TRAMADOL HCL 50 MG PO TABS: 50.0000 mg | ORAL_TABLET | Freq: Four times a day (QID) | ORAL | Status: DC | PRN

## 2015-04-03 MED ORDER — LIDOCAINE-EPINEPHRINE 1 %-1:100000 IJ SOLN
INTRAMUSCULAR | Status: AC
  Filled 2015-04-03: qty 1

## 2015-04-03 MED ORDER — AMOXICILLIN 400 MG/5ML PO SUSR: ORAL | Status: DC

## 2015-04-03 MED ORDER — FUROSEMIDE 20 MG PO TABS
10.0000 mg | ORAL_TABLET | Freq: Every day | ORAL | Status: DC
  Administered 2015-04-04: 10 mg via ORAL
  Filled 2015-04-03 (×2): qty 1

## 2015-04-03 MED ORDER — HYDRALAZINE HCL 20 MG/ML IJ SOLN: 10.0000 mg | Freq: Four times a day (QID) | INTRAMUSCULAR | Status: DC | PRN

## 2015-04-03 MED ORDER — FENTANYL CITRATE (PF) 100 MCG/2ML IJ SOLN
INTRAMUSCULAR | Status: AC
  Filled 2015-04-03: qty 2

## 2015-04-03 MED ORDER — MORPHINE SULFATE (PF) 2 MG/ML IV SOLN
2.0000 mg | INTRAVENOUS | Status: DC | PRN
  Administered 2015-04-03: 2 mg via INTRAVENOUS
  Filled 2015-04-03: qty 1

## 2015-04-03 MED ORDER — DEXAMETHASONE SODIUM PHOSPHATE 4 MG/ML IJ SOLN
4.0000 mg | Freq: Two times a day (BID) | INTRAMUSCULAR | Status: DC
  Administered 2015-04-03 – 2015-04-04 (×2): 4 mg via INTRAVENOUS
  Filled 2015-04-03 (×3): qty 1

## 2015-04-03 MED ORDER — ONDANSETRON HCL 4 MG PO TABS: 4.0000 mg | ORAL_TABLET | ORAL | Status: DC | PRN

## 2015-04-03 MED ORDER — GLYCOPYRROLATE 0.2 MG/ML IJ SOLN
INTRAMUSCULAR | Status: DC | PRN
  Administered 2015-04-03: 0.6 mg via INTRAVENOUS

## 2015-04-03 MED ORDER — LABETALOL HCL 5 MG/ML IV SOLN
INTRAVENOUS | Status: AC
  Filled 2015-04-03: qty 4

## 2015-04-03 MED ORDER — AMLODIPINE BESYLATE 5 MG PO TABS
5.0000 mg | ORAL_TABLET | Freq: Every day | ORAL | Status: DC
  Administered 2015-04-03: 5 mg via ORAL
  Filled 2015-04-03 (×2): qty 1

## 2015-04-03 MED ORDER — LABETALOL HCL 5 MG/ML IV SOLN
5.0000 mg | INTRAVENOUS | Status: DC | PRN
  Administered 2015-04-03 (×2): 5 mg via INTRAVENOUS

## 2015-04-03 MED ORDER — ONDANSETRON HCL 4 MG/2ML IJ SOLN
INTRAMUSCULAR | Status: DC | PRN
  Administered 2015-04-03: 4 mg via INTRAVENOUS

## 2015-04-03 MED ORDER — AMLODIPINE BESYLATE 5 MG PO TABS: 5.0000 mg | ORAL_TABLET | Freq: Every day | ORAL | Status: DC

## 2015-04-03 MED ORDER — NEOSTIGMINE METHYLSULFATE 10 MG/10ML IV SOLN
INTRAVENOUS | Status: DC | PRN
  Administered 2015-04-03: 3 mg via INTRAVENOUS

## 2015-04-03 MED ORDER — LACTATED RINGERS IV SOLN
INTRAVENOUS | Status: DC | PRN
  Administered 2015-04-03 (×2): via INTRAVENOUS

## 2015-04-03 MED ORDER — DOCUSATE SODIUM 100 MG PO CAPS
100.0000 mg | ORAL_CAPSULE | Freq: Two times a day (BID) | ORAL | Status: DC
  Administered 2015-04-03 – 2015-04-04 (×3): 100 mg via ORAL
  Filled 2015-04-03 (×3): qty 1

## 2015-04-03 MED ORDER — METOPROLOL SUCCINATE ER 25 MG PO TB24
25.0000 mg | ORAL_TABLET | Freq: Two times a day (BID) | ORAL | Status: DC
  Administered 2015-04-03 – 2015-04-04 (×2): 25 mg via ORAL
  Filled 2015-04-03 (×2): qty 1

## 2015-04-03 MED ORDER — HYDRALAZINE HCL 20 MG/ML IJ SOLN
INTRAMUSCULAR | Status: AC
  Administered 2015-04-03: 14:00:00
  Filled 2015-04-03: qty 1

## 2015-04-03 MED ORDER — ONDANSETRON HCL 4 MG/2ML IJ SOLN: 4.0000 mg | Freq: Once | INTRAMUSCULAR | Status: DC | PRN

## 2015-04-03 MED ORDER — POTASSIUM CHLORIDE IN NACL 20-0.45 MEQ/L-% IV SOLN
INTRAVENOUS | Status: DC
  Administered 2015-04-03: 15:00:00 via INTRAVENOUS
  Filled 2015-04-03 (×2): qty 1000

## 2015-04-03 MED ORDER — FLUTICASONE PROPIONATE 50 MCG/ACT NA SUSP: 2.0000 | Freq: Every day | NASAL | Status: DC

## 2015-04-03 MED ORDER — LACTATED RINGERS IV SOLN: INTRAVENOUS | Status: DC

## 2015-04-03 MED ORDER — HYDROCODONE-ACETAMINOPHEN 7.5-325 MG/15ML PO SOLN
10.0000 mL | ORAL | Status: DC | PRN
  Administered 2015-04-03: 15 mL via ORAL
  Filled 2015-04-03 (×2): qty 15

## 2015-04-03 MED ORDER — HYDROCODONE-ACETAMINOPHEN 7.5-325 MG/15ML PO SOLN: ORAL | Status: DC

## 2015-04-03 SURGICAL SUPPLY — 14 items
BLADE SURG 15 STRL LF DISP TIS (BLADE) ×1 IMPLANT
BLADE SURG 15 STRL SS (BLADE) ×2
CANISTER SUCT 1200ML W/VALVE (MISCELLANEOUS) ×3 IMPLANT
CUP MEDICINE 2OZ PLAST GRAD ST (MISCELLANEOUS) ×3 IMPLANT
GOWN STRL REUS W/ TWL LRG LVL3 (GOWN DISPOSABLE) ×2 IMPLANT
GOWN STRL REUS W/TWL LRG LVL3 (GOWN DISPOSABLE) ×4
LABEL OR SOLS (LABEL) IMPLANT
NS IRRIG 500ML POUR BTL (IV SOLUTION) ×3 IMPLANT
PACK HEAD/NECK (MISCELLANEOUS) ×3 IMPLANT
SUT SILK 2 0 SH (SUTURE) ×3 IMPLANT
SUT SILK 3 0 (SUTURE) ×2
SUT SILK 3-0 18XBRD TIE 12 (SUTURE) ×1 IMPLANT
SUT VIC AB 4-0 RB1 18 (SUTURE) ×3 IMPLANT
SUT VICRYL+ 3-0 27IN RB-1 (SUTURE) IMPLANT

## 2015-04-03 NOTE — Op Note (Signed)
04/03/2015  12:51 PM    Arthur Blair  [id number]   Pre-Op Diagnosis:  LEFT ORAL TONGUE SQUAMOUS CELL CARCINOMA Post-op Diagnosis: Same  Procedure:   Left partial glossectomy with primary closure (6 cm total length of closure)  Surgeon:  [name]  Anesthesia:  General endotracheal  EBL:  25 cc  Complications:  None  Findings: 1.5 x 1 cm ulcerative lesion involving the mid left lateral oral tongue, with an additional palpable deep extension of 1.5-2 cm posteriorly (submucosal)  Procedure: After the patient was identified in holding and the procedure was reviewed.  The patient was taken to the operating room and with the patient in a comfortable supine position,  general endotracheal anesthesia was induced without difficulty.  A proper time-out was performed, confirming the operative site and procedure.    The tongue was inspected with the above findings. There appeared to be a palpable mass extending into the depth of the tongue immediately below the ulcerative area but also extending posteriorly as described above. Clearly the lesion was much larger than the superficial ulceration. A bite block was placed to open the mouth. A 2-0 silk suture was passed through the tip of the tongue to be used as a retractor. The margins of excision were roughly marked with a pen, palpating the submucosal mass to include it is part of the resection. 1% lidocaine with epinephrine 1 200,000 was then injected around the margins of the excision. An additional oral retractor was used to retract the lids out of the way on the left side. A football shaped incision was made around the lesion with a 15 blade, cutting just through the mucosa. Initial dissection began anteriorly, using scissors to dissect into the tongue musculature, achieving a depth of at least 1.5 cm, and working posteriorly. Later the Bovie was used to divide the tongue muscle as the lesion was excised. The mass was frequently repalpated  to make sure there was a margin of soft tongue muscle taken with the lesion. At no point was there any gross violation of the tumor capsule. The lingual artery was encountered and had to be divided and was tied with a 3-0 silk suture stick tie. Dissection proceeded posteriorly into the lesion was completely excised. The specimen was sent for pathology with a stitch denoting the anterior margin, and the superior margin (short stitch). Residual bleeding was controlled with the Bovie. The deep tongue musculature was then closed in an interrupted fashion on itself with 4-0 Vicryl suture, closing the dead space. Next a 4-0 chromic gut suture was used to close the mucosa in a running locked stitch. There was some mucosal overlap along the superior margin, so prior to closure of the mucosa, an additional superior margin of 5 mm was taken and sent as a specimen for examination, mainly to serve the purpose of removing redundant tissue. However there was no gross tumor suspected at this margin. As the closure completed, there was a bit of a dogear deformity anteriorly which was further excised, and the defect closed with 4-0 chromic gut suture.  The patient was then returned to the anesthesiologist in good condition for awakening. The patient was awakened and taken to the recovery room in good condition.   Disposition:   PACU for observation  Plan: The patient will be observed for a short time in PACU to determine appropriateness of discharge based on oral intake and pain control. If appropriate he'll be discharged home with pain medication and antibiotics.  [name] 04/03/2015 12:51 PM

## 2015-04-03 NOTE — Anesthesia Procedure Notes (Signed)
Procedure Name: Intubation Date/Time: 04/03/2015 11:20 AM Performed by: [id number], Toby Breithaupt Pre-anesthesia Checklist: Patient identified, Timeout performed, Patient being monitored, Suction available and Emergency Drugs available Patient Re-evaluated:Patient Re-evaluated prior to inductionOxygen Delivery Method: Circle system utilized Preoxygenation: Pre-oxygenation with 100% oxygen Intubation Type: IV induction Laryngoscope Size: Mac and 3 Grade View: Grade II Tube type: Oral Tube size: 7.5 mm Number of attempts: 1 Secured at: 22 cm Tube secured with: Tape

## 2015-04-03 NOTE — Plan of Care (Signed)
Problem: Respiratory: Goal: Ability to achieve and maintain a regular respiratory rate will improve Outcome: Progressing Pt is on continuous pulse ox.

## 2015-04-03 NOTE — Transfer of Care (Signed)
Immediate Anesthesia Transfer of Care Note  Patient: Arthur Blair  Procedure(s) Performed: Procedure(s): EXCISION OF TONGUE LESION, PARTIAL GLOSSECTOMY (N/A)  Patient Location: PACU  Anesthesia Type:General  Level of Consciousness: awake  Airway & Oxygen Therapy: Patient connected to face mask oxygen  Post-op Assessment: Report given to RN  Post vital signs: stable  Last Vitals:  Filed Vitals:   04/03/15 1043 04/03/15 1300  BP: 158/88 169/90  Pulse: 61 58  Temp: 36.7 C   Resp: 18 14    Complications: No apparent anesthesia complications

## 2015-04-03 NOTE — Consult Note (Signed)
[place] at [place] NAME: Arthur Blair    MR#:  [id number]  DATE OF BIRTH:  [birth year]  DATE OF ADMISSION:  04/03/2015  PRIMARY CARE PHYSICIAN: Rusty Aus., MD   CONSULT REQUESTING/REFERRING PHYSICIAN: Dr. Hope Budds)  REASON FOR CONSULT: HTN  CHIEF COMPLAINT:  No chief complaint on file.   HISTORY OF PRESENT ILLNESS:  Arthur Blair  is a 82 y.o. male with a known history of  arthritis, hypertension, prostate cancer presently in PACU after having surgery for a tongue lesion. This was resected under general anesthesia. During recovery in the PACU patient was found to have blood pressure with systolics close to [id number]. Presently blood pressure 170 after dose of labetalol. Heartrate 59. Patient has had mild posterior headache. No focal neurological deficits. No chest pain or shortness of breath. No recent change in medications. Reviewing his recent visit with Dr. Sabra Heck his blood pressure was 159/80.   no significant pain at this point.   PAST MEDICAL HISTORY:   Past Medical History  Diagnosis Date  . Arthritis   . Hypertension   . Shortness of breath dyspnea     with exertion  . Claustrophobia   . GERD (gastroesophageal reflux disease)   . Cancer [facility])     prostate  . IBS (irritable bowel syndrome)   . Involuntary movements     Legs  . Macular degeneration, left eye     PAST SURGICAL HISTOIRY:   Past Surgical History  Procedure Laterality Date  . Nasal septum surgery    . Joint replacement Left     Total Hip Replacement  . Eye surgery Bilateral     Cataract Extraction with IOL  . Excision of tongue lesion N/A 04/03/2015    Procedure: EXCISION OF TONGUE LESION, PARTIAL GLOSSECTOMY;  Surgeon: Clyde Canterbury, MD;  Location: [facility] ORS;  Service: ENT;  Laterality: N/A;    SOCIAL HISTORY:   Social History  Substance Use Topics  . Smoking status: Former Smoker -- 1.00 packs/day    Types: Cigarettes  . Smokeless tobacco:  Never Used     Comment: QUIT 40 YEARS AGO  . Alcohol Use: No    FAMILY HISTORY:   Family History  Problem Relation Age of Onset  . Hypertension Mother   . COPD Sister     DRUG ALLERGIES:   Allergies  Allergen Reactions  . Sulfa Antibiotics Nausea And Vomiting    "pain in torso"    REVIEW OF SYSTEMS:   ROS  CONSTITUTIONAL: No fever, fatigue or weakness.  EYES: No blurred or double vision.  EARS, NOSE, AND THROAT: pain tongue RESPIRATORY: No cough, shortness of breath, wheezing or hemoptysis.  CARDIOVASCULAR: No chest pain, orthopnea, edema.  GASTROINTESTINAL: No nausea, vomiting, diarrhea or abdominal pain.  GENITOURINARY: No dysuria, hematuria.  ENDOCRINE: No polyuria, nocturia. HEMATOLOGY: No anemia, easy bruising or bleeding. SKIN: No rash or lesion. MUSCULOSKELETAL: No joint pain or arthritis.   NEUROLOGIC: No tingling, numbness, weakness.  PSYCHIATRY: No anxiety or depression.   MEDICATIONS AT HOME:   Prior to Admission medications   Medication Sig Start Date End Date Taking? Authorizing Provider  aspirin 81 MG tablet Take 81 mg by mouth 4 (four) times daily.    Yes Historical Provider, MD  fluticasone (FLONASE) 50 MCG/ACT nasal spray Place 2 sprays into both nostrils daily.  12/29/12  Yes Historical Provider, MD  furosemide (LASIX) 20 MG tablet Take 10 mg by mouth daily.  12/10/12  Yes  Historical Provider, MD  magnesium gluconate (MAGONATE) 500 MG tablet Take 500 mg by mouth daily.   Yes Historical Provider, MD  metoprolol succinate (TOPROL-XL) 25 MG 24 hr tablet Take 25 mg by mouth 2 (two) times daily.  10/15/12  Yes Historical Provider, MD  Multiple Vitamin (MULTIVITAMIN) tablet Take 1 tablet by mouth daily.   Yes Historical Provider, MD  omeprazole (PRILOSEC) 20 MG capsule Take 20 mg by mouth daily.   Yes Historical Provider, MD  Probiotic Product (PROBIOTIC ACIDOPHILUS) CAPS Take 1 capsule by mouth daily.   Yes Historical Provider, MD  traMADol (ULTRAM) 50 MG  tablet Take 50 mg by mouth as needed for pain.   Yes Historical Provider, MD  amoxicillin (AMOXIL) 400 MG/5ML suspension 2-1/4 teaspoons by mouth twice daily for 10 days 04/03/15   Clyde Canterbury, MD  HYDROcodone-acetaminophen (HYCET) 7.5-325 mg/15 ml solution 10-15 cc by mouth every 4-6 hours as needed for pain 04/03/15   Clyde Canterbury, MD  HYDROcodone-acetaminophen (NORCO/VICODIN) 5-325 MG per tablet Take by mouth every 6 (six) hours as needed.  10/05/12   Historical Provider, MD      VITAL SIGNS:  Blood pressure 167/77, pulse 62, temperature 96.9 F (36.1 C), temperature source Axillary, resp. rate 12, weight 92.715 kg (204 lb 6.4 oz), SpO2 97 %.  PHYSICAL EXAMINATION:  GENERAL:  82 y.o.-year-old patient lying in the bed with no acute distress.  EYES: Pupils equal, round, reactive to light and accommodation. No scleral icterus. Extraocular muscles intact.  HEENT: Head atraumatic, normocephalic. Tongue wound with swelling. No bleeding NECK:  Supple, no jugular venous distention. No thyroid enlargement, no tenderness.  LUNGS: Normal breath sounds bilaterally, no wheezing, rales,rhonchi or crepitation. No use of accessory muscles of respiration.  CARDIOVASCULAR: S1, S2 normal. No murmurs, rubs, or gallops.  ABDOMEN: Soft, nontender, nondistended. Bowel sounds present. No organomegaly or mass.  EXTREMITIES: No pedal edema, cyanosis, or clubbing.  NEUROLOGIC: Cranial nerves II through XII are intact. Muscle strength 5/5 in all extremities. Sensation intact. Gait not checked.  PSYCHIATRIC: The patient is alert and oriented x 3.  SKIN: No obvious rash, lesion, or ulcer.   LABORATORY PANEL:   CBC  Recent Labs Lab 04/01/15 1436  WBC 5.0  HGB 12.2*  HCT 36.3*  PLT 169   ------------------------------------------------------------------------------------------------------------------  Chemistries   Recent Labs Lab 04/01/15 1436  NA 139  K 4.7  CL 104  CO2 29  GLUCOSE 88  BUN 32*   CREATININE 1.06  CALCIUM 9.0   ------------------------------------------------------------------------------------------------------------------  Cardiac Enzymes No results for input(s): TROPONINI in the last 168 hours. ------------------------------------------------------------------------------------------------------------------  RADIOLOGY:  No results found.  EKG:   Orders placed or performed during the hospital encounter of 04/01/15  . EKG 12-Lead  . EKG 12-Lead    IMPRESSION AND PLAN:    * Accelerated HTN 1 dose of labetalol and hydralazine given in PACU. Will order Norvasc 5 mg daily as his BP has been elevated even at his PCP visit recently. Can d/c on norvasc in addition to his home meds.  * Tongue lesion s/p resection  * Arthritis  * DVT prophylaxis SCDs   All the records are reviewed and case discussed with Consulting provider. Management plans discussed with the patient, family and they are in agreement.   TOTAL TIME TAKING CARE OF THIS PATIENT: 40 minutes.    Hillary Bow R M.D on 04/03/2015 at 3:46 PM  Between 7am to 6pm - Pager - [phone]  After 6pm go to [email or web address] -  password [email or web address] [facility]  [facility]  Office  [phone]  CC: Primary care Physician: Rusty Aus., MD     Note: This dictation was prepared with Dragon dictation along with smaller phrase technology. Any transcriptional errors that result from this process are unintentional.

## 2015-04-03 NOTE — Anesthesia Preprocedure Evaluation (Addendum)
Anesthesia Evaluation  Patient identified by MRN, date of birth, ID band Patient awake    Reviewed: Allergy & Precautions, H&P , NPO status , Patient's Chart, lab work & pertinent test results, reviewed documented beta blocker date and time   Airway Mallampati: II  TM Distance: >3 FB Neck ROM: full    Dental  (+) Edentulous Upper, Edentulous Lower   Pulmonary neg pulmonary ROS, former smoker,    Pulmonary exam normal        Cardiovascular Exercise Tolerance: Good hypertension, negative cardio ROS Normal cardiovascular exam Rate:Normal  Pos clearance for procedure    Neuro/Psych negative neurological ROS  negative psych ROS   GI/Hepatic negative GI ROS, Neg liver ROS, GERD  ,  Endo/Other  negative endocrine ROS  Renal/GU negative Renal ROS  negative genitourinary   Musculoskeletal negative musculoskeletal ROS (+)   Abdominal   Peds negative pediatric ROS (+)  Hematology negative hematology ROS (+)   Anesthesia Other Findings   Reproductive/Obstetrics negative OB ROS                            Anesthesia Physical Anesthesia Plan  ASA: III  Anesthesia Plan: General LMA   Post-op Pain Management:    Induction:   Airway Management Planned:   Additional Equipment:   Intra-op Plan:   Post-operative Plan:   Informed Consent: I have reviewed the patients History and Physical, chart, labs and discussed the procedure including the risks, benefits and alternatives for the proposed anesthesia with the patient or authorized representative who has indicated his/her understanding and acceptance.     Plan Discussed with: CRNA  Anesthesia Plan Comments:         Anesthesia Quick Evaluation

## 2015-04-03 NOTE — Progress Notes (Signed)
Patient ID: Arthur Blair, male   DOB: 1915/10/17, 82 y.o.   MRN: [id number] Arthur Blair, Arthur Blair [id number] [birth year] Arthur Nearing, MD   SUBJECTIVE: This 82 y.o. year old male is status post EXCISION OF TONGUE LESION. In postop he had elevated BP. Prime Doc consulted and are managing. Complains of left sided headache, and was a little hard to eat.   Medications:  Current Facility-Administered Medications  Medication Dose Route Frequency Provider Last Rate Last Dose  . 0.45 % NaCl with KCl 20 mEq / L infusion   Intravenous Continuous Hillary Bow, MD 40 mL/hr at 04/03/15 1553    . amLODipine (NORVASC) tablet 5 mg  5 mg Oral Daily Hillary Bow, MD   5 mg at 04/03/15 1601  . docusate sodium (COLACE) capsule 100 mg  100 mg Oral BID Clyde Canterbury, MD   100 mg at 04/03/15 1529  . fentaNYL (SUBLIMAZE) 100 MCG/2ML injection           . [START ON 04/04/2015] fluticasone (FLONASE) 50 MCG/ACT nasal spray 2 spray  2 spray Each Nare Daily Srikar Sudini, MD      . furosemide (LASIX) tablet 10 mg  10 mg Oral Daily Srikar Sudini, MD      . hydrALAZINE (APRESOLINE) injection 10 mg  10 mg Intravenous Q6H PRN Srikar Sudini, MD      . HYDROcodone-acetaminophen (HYCET) 7.5-325 mg/15 ml solution 10-15 mL  10-15 mL Oral Q4H PRN Clyde Canterbury, MD   15 mL at 04/03/15 1534  . labetalol (NORMODYNE,TRANDATE) 5 MG/ML injection           . metoprolol succinate (TOPROL-XL) 24 hr tablet 25 mg  25 mg Oral BID Srikar Sudini, MD      . morphine 2 MG/ML injection 2-4 mg  2-4 mg Intravenous Q2H PRN Clyde Canterbury, MD      . ondansetron [facility]) tablet 4 mg  4 mg Oral Q4H PRN Clyde Canterbury, MD       Or  . ondansetron [facility]) injection 4 mg  4 mg Intravenous Q4H PRN Clyde Canterbury, MD      .  Medications Prior to Admission  Medication Sig Dispense Refill  . aspirin 81 MG tablet Take 81 mg by mouth 4 (four) times daily.     . fluticasone (FLONASE) 50 MCG/ACT nasal spray Place 2 sprays into both nostrils daily.     . furosemide (LASIX)  20 MG tablet Take 10 mg by mouth daily.     . magnesium gluconate (MAGONATE) 500 MG tablet Take 500 mg by mouth daily.    . metoprolol succinate (TOPROL-XL) 25 MG 24 hr tablet Take 25 mg by mouth 2 (two) times daily.     . Multiple Vitamin (MULTIVITAMIN) tablet Take 1 tablet by mouth daily.    Marland Kitchen omeprazole (PRILOSEC) 20 MG capsule Take 20 mg by mouth daily.    . Probiotic Product (PROBIOTIC ACIDOPHILUS) CAPS Take 1 capsule by mouth daily.    . traMADol (ULTRAM) 50 MG tablet Take 50 mg by mouth as needed for pain.    Marland Kitchen HYDROcodone-acetaminophen (NORCO/VICODIN) 5-325 MG per tablet Take by mouth every 6 (six) hours as needed.       OBJECTIVE:  PHYSICAL EXAM  Vitals: Blood pressure 158/76, pulse 71, temperature 96.5 F (35.8 C), temperature source Axillary, resp. rate 16, height 6' (1.829 m), weight 92.715 kg (204 lb 6.4 oz), SpO2 98 %.. General: Well-developed, Well-nourished in no acute distress Mood: Mood and affect well adjusted, pleasant and  cooperative. Orientation: Grossly alert and oriented. Vocal Quality: No hoarseness. Communicates verbally. head and Face: NCAT. No facial asymmetry. No visible skin lesions. No significant facial scars. No tenderness with sinus percussion. Facial strength normal and symmetric. Oral Cavity/ Oropharynx: Lips are normal with no lesions. Teeth partially edentulous. Gingiva healthy with no lesions or gingivitis. Does have edema of tongue, left floor of mouth, but no evidence of tongue hematoma. Tongue is soft to palpation. Respiratory: Normal respiratory effort without labored breathing.   MEDICAL DECISION MAKING: Data Review: No results found for this or any previous visit (from the past 48 hour(s)).Marland Kitchen No results found..   ASSESSMENT: S/p excision SCCA left oral tongue  PLAN: Will watch overnight and make sure he is doing better with the BP by tomorrow, as well as able to manage a PO diet. Headache could be due to the BP elevation, although could also be  due to referred pain or pain from keeping the jaw extended open during surgery.    Arthur Nearing, MD 04/03/2015 5:36 PM

## 2015-04-03 NOTE — H&P (Signed)
History and physical reviewed and will be scanned in later. No change in medical status reported by the patient or family, appears stable for surgery. All questions regarding the procedure answered, and patient (or family if a child) expressed understanding of the procedure.  Arthur Blair S @TODAY@ 

## 2015-04-04 DIAGNOSIS — C029 Malignant neoplasm of tongue, unspecified: Secondary | ICD-10-CM | POA: Diagnosis not present

## 2015-04-04 MED ORDER — DOCUSATE SODIUM 100 MG PO CAPS: 100.0000 mg | ORAL_CAPSULE | Freq: Two times a day (BID) | ORAL | Status: DC

## 2015-04-04 NOTE — Progress Notes (Signed)
IV and tele were removed. Discharge instructions, follow-up appointments, and prescriptions were provided to the pt. The pt was taken downstairs via wheelchair to meet his ride from [facility]. All questions answered.

## 2015-04-04 NOTE — Progress Notes (Signed)
[place] at [place] NAME: Arthur Blair    MR#:  [id number]  DATE OF BIRTH:  02-Dec-1915  SUBJECTIVE:  CHIEF COMPLAINT:  No chief complaint on file.  Tongue pain better. HTN improved  REVIEW OF SYSTEMS:    Review of Systems  Constitutional: Negative for fever and chills.  HENT: Negative for sore throat.   Eyes: Negative for blurred vision, double vision and pain.  Respiratory: Negative for cough, hemoptysis, shortness of breath and wheezing.   Cardiovascular: Negative for chest pain, palpitations, orthopnea and leg swelling.  Gastrointestinal: Negative for heartburn, nausea, vomiting, abdominal pain, diarrhea and constipation.  Genitourinary: Negative for dysuria and hematuria.  Musculoskeletal: Negative for back pain and joint pain.  Skin: Negative for rash.  Neurological: Negative for sensory change, speech change, focal weakness and headaches.  Endo/Heme/Allergies: Does not bruise/bleed easily.  Psychiatric/Behavioral: Negative for depression. The patient is not nervous/anxious.    Pain tongue   DRUG ALLERGIES:   Allergies  Allergen Reactions  . Sulfa Antibiotics Nausea And Vomiting    "pain in torso"    VITALS:  Blood pressure 118/58, pulse 61, temperature 97.7 F (36.5 C), temperature source Axillary, resp. rate 18, height 6' (1.829 m), weight 92.715 kg (204 lb 6.4 oz), SpO2 96 %.  PHYSICAL EXAMINATION:   Physical Exam  GENERAL:  82 y.o.-year-old patient lying in the bed with no acute distress.  EYES: Pupils equal, round, reactive to light and accommodation. No scleral icterus. Extraocular muscles intact.  HEENT: Head atraumatic, normocephalic. Oropharynx and nasopharynx clear.  NECK:  Supple, no jugular venous distention. No thyroid enlargement, no tenderness.  LUNGS: Normal breath sounds bilaterally, no wheezing, rales, rhonchi. No use of accessory muscles of respiration.  CARDIOVASCULAR: S1, S2 normal. No  murmurs, rubs, or gallops.  ABDOMEN: Soft, nontender, nondistended. Bowel sounds present. No organomegaly or mass.  EXTREMITIES: No cyanosis, clubbing or edema b/l.    NEUROLOGIC: Cranial nerves II through XII are intact. No focal Motor or sensory deficits b/l.   PSYCHIATRIC: The patient is alert and oriented x 3.  SKIN: No obvious rash, lesion, or ulcer.   Tongue wound. No bleeding. Mild swelling  LABORATORY PANEL:   CBC  Recent Labs Lab 04/01/15 1436  WBC 5.0  HGB 12.2*  HCT 36.3*  PLT 169   ------------------------------------------------------------------------------------------------------------------  Chemistries   Recent Labs Lab 04/01/15 1436  NA 139  K 4.7  CL 104  CO2 29  GLUCOSE 88  BUN 32*  CREATININE 1.06  CALCIUM 9.0   ------------------------------------------------------------------------------------------------------------------  Cardiac Enzymes No results for input(s): TROPONINI in the last 168 hours. ------------------------------------------------------------------------------------------------------------------  RADIOLOGY:  No results found.   ASSESSMENT AND PLAN:   * Accelerated HTN Improved. Started Norvasc 5 mg daily. Prescription left in chart.  * Tongue lesion s/p resection   All the records are reviewed and case discussed with Care Management/Social Workerr. Management plans discussed with the patient, family and they are in agreement.  DVT Prophylaxis: SCDs  TOTAL TIME TAKING CARE OF THIS PATIENT: 25 minutes.   Likely d/c later today   Neita Carp M.D on 04/04/2015 at 12:32 PM  Between 7am to 6pm - Pager - [phone]  After 6pm go to [email or web address] - password [email or web address] [place] [facility]  Office  [phone]  CC: Primary care physician; Rusty Aus., MD    Note: This dictation was prepared with Dragon dictation along with smaller phrase technology. Any transcriptional errors that result from  this process are unintentional.

## 2015-04-04 NOTE — Progress Notes (Signed)
Patient ID: Arthur Blair, male   DOB: Mar 22, 1915, 82 y.o.   MRN: [id number] Arthur, Blair [id number] Nov 10, 1915 Arthur Nearing, MD   SUBJECTIVE: This 82 y.o. year old male is status post EXCISION OF TONGUE LESION. He is sore this AM, but headache is better, and feels he can swallow reasonably. Wants to go home.  Medications:  Current Facility-Administered Medications  Medication Dose Route Frequency Provider Last Rate Last Dose  . 0.45 % NaCl with KCl 20 mEq / L infusion   Intravenous Continuous Hillary Bow, MD 40 mL/hr at 04/03/15 1553    . amLODipine (NORVASC) tablet 5 mg  5 mg Oral Daily Hillary Bow, MD   5 mg at 04/03/15 1601  . dexamethasone (DECADRON) injection 4 mg  4 mg Intravenous Q12H Clyde Canterbury, MD   4 mg at 04/03/15 2255  . docusate sodium (COLACE) capsule 100 mg  100 mg Oral BID Clyde Canterbury, MD   100 mg at 04/03/15 2255  . fluticasone (FLONASE) 50 MCG/ACT nasal spray 2 spray  2 spray Each Nare Daily Srikar Sudini, MD      . furosemide (LASIX) tablet 10 mg  10 mg Oral Daily Hillary Bow, MD   10 mg at 04/03/15 1630  . hydrALAZINE (APRESOLINE) injection 10 mg  10 mg Intravenous Q6H PRN Srikar Sudini, MD      . HYDROcodone-acetaminophen (HYCET) 7.5-325 mg/15 ml solution 10-15 mL  10-15 mL Oral Q4H PRN Clyde Canterbury, MD   15 mL at 04/03/15 1534  . metoprolol succinate (TOPROL-XL) 24 hr tablet 25 mg  25 mg Oral BID Hillary Bow, MD   25 mg at 04/03/15 2256  . morphine 2 MG/ML injection 2-4 mg  2-4 mg Intravenous Q2H PRN Clyde Canterbury, MD   2 mg at 04/03/15 1958  . ondansetron (ZOFRAN) tablet 4 mg  4 mg Oral Q4H PRN Clyde Canterbury, MD       Or  . ondansetron [facility]) injection 4 mg  4 mg Intravenous Q4H PRN Clyde Canterbury, MD   4 mg at 04/03/15 1958  .  Medications Prior to Admission  Medication Sig Dispense Refill  . aspirin 81 MG tablet Take 81 mg by mouth 4 (four) times daily.     . fluticasone (FLONASE) 50 MCG/ACT nasal spray Place 2 sprays into both nostrils daily.     .  furosemide (LASIX) 20 MG tablet Take 10 mg by mouth daily.     . magnesium gluconate (MAGONATE) 500 MG tablet Take 500 mg by mouth daily.    . metoprolol succinate (TOPROL-XL) 25 MG 24 hr tablet Take 25 mg by mouth 2 (two) times daily.     . Multiple Vitamin (MULTIVITAMIN) tablet Take 1 tablet by mouth daily.    Marland Kitchen omeprazole (PRILOSEC) 20 MG capsule Take 20 mg by mouth daily.    . Probiotic Product (PROBIOTIC ACIDOPHILUS) CAPS Take 1 capsule by mouth daily.    . traMADol (ULTRAM) 50 MG tablet Take 50 mg by mouth as needed for pain.    Marland Kitchen HYDROcodone-acetaminophen (NORCO/VICODIN) 5-325 MG per tablet Take by mouth every 6 (six) hours as needed.       OBJECTIVE:  PHYSICAL EXAM  Vitals: Blood pressure 149/69, pulse 67, temperature 98.6 F (37 C), temperature source Oral, resp. rate 18, height 6' (1.829 m), weight 92.715 kg (204 lb 6.4 oz), SpO2 96 %.. General: Well-developed, Well-nourished in no acute distress Mood: Mood and affect well adjusted, pleasant and cooperative. Orientation: Grossly alert and oriented. Vocal  Quality: No hoarseness. Communicates verbally. head and Face: NCAT. No facial asymmetry. No visible skin lesions. No significant facial scars. No tenderness with sinus percussion. Facial strength normal and symmetric. Oral Cavity/ Oropharynx: Lips are normal with no lesions. Tongue with mild to moderate edema, but soft to palpation. Some bloody discharge noted, but no active bleeding. Indirect Laryngoscopy/Nasopharyngoscopy: Visualization of the larynx, hypopharynx and nasopharynx is not possible in this setting with routine examination. Respiratory: Normal respiratory effort without labored breathing.  MEDICAL DECISION MAKING: Data Review: No results found for this or any previous visit (from the past 48 hour(s)).Marland Kitchen No results found..   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 Doing better this AM with BP down from yesterday, though still on the  high side.  PLAN: Potential d/c today, I would like to make sure he can get up and around this AM, and does OK with breakfast as far as swallowing. Also get any further input from [facility] on BP.   Arthur Nearing, MD 04/04/2015 7:58 AM

## 2015-04-04 NOTE — Progress Notes (Signed)
Patient is sitting up in bed eating soft-diet breakfast.  Patient states that he has difficulty swallowing but he is trying his best to eat.  He has difficulty speaking due to tongue swelling, but his airway is patent and there is no stridor noted.  Patient is experiencing 5/10 pain, but declined pain medication because he doesn't like the side effects.  Patient is in good spirits and has no complaints at this time.  No acute distress noted.

## 2015-04-04 NOTE — Progress Notes (Signed)
   04/04/15 0504  Urine Characteristics  Bladder Scan Volume (mL) (>500)  Pt has the urge to go but can't at this time. Last void at 0106. Bladder scan result above. Will give til 0900 for pt to void again. Will cont to monitor.

## 2015-04-04 NOTE — Progress Notes (Signed)
BP reviewed. Much improved. I have printed norvasc prescription 30 day supply. PCP f/u in 1 week

## 2015-04-04 NOTE — Anesthesia Postprocedure Evaluation (Signed)
Anesthesia Post Note  Patient: Arthur Blair  Procedure(s) Performed: Procedure(s) (LRB): EXCISION OF TONGUE LESION, PARTIAL GLOSSECTOMY (N/A)  Patient location during evaluation: PACU Anesthesia Type: General Level of consciousness: awake and alert Pain management: pain level controlled Vital Signs Assessment: post-procedure vital signs reviewed and stable Respiratory status: spontaneous breathing, nonlabored ventilation, respiratory function stable and patient connected to nasal cannula oxygen Cardiovascular status: blood pressure returned to baseline and stable Postop Assessment: no signs of nausea or vomiting Anesthetic complications: no    Last Vitals:  Filed Vitals:   04/04/15 1031 04/04/15 1032  BP:  118/58  Pulse: 63 61  Temp: 36.5 C   Resp: 18 18    Last Pain:  Filed Vitals:   04/04/15 1033  PainSc: [place]

## 2015-04-05 LAB — SURGICAL PATHOLOGY

## 2015-04-12 ENCOUNTER — Ambulatory Visit: Payer: Medicare Other | Admitting: Radiation Oncology

## 2015-04-12 ENCOUNTER — Ambulatory Visit: Admission: RE | Admit: 2015-04-12 | Payer: Medicare Other | Source: Ambulatory Visit | Admitting: Radiation Oncology

## 2015-04-15 ENCOUNTER — Encounter (INDEPENDENT_AMBULATORY_CARE_PROVIDER_SITE_OTHER): Payer: Medicare Other | Admitting: Ophthalmology

## 2015-04-15 DIAGNOSIS — H43813 Vitreous degeneration, bilateral: Secondary | ICD-10-CM | POA: Diagnosis not present

## 2015-04-15 DIAGNOSIS — H353112 Nonexudative age-related macular degeneration, right eye, intermediate dry stage: Secondary | ICD-10-CM

## 2015-04-15 DIAGNOSIS — H35033 Hypertensive retinopathy, bilateral: Secondary | ICD-10-CM

## 2015-04-15 DIAGNOSIS — I10 Essential (primary) hypertension: Secondary | ICD-10-CM

## 2015-04-15 DIAGNOSIS — H353221 Exudative age-related macular degeneration, left eye, with active choroidal neovascularization: Secondary | ICD-10-CM

## 2015-05-03 ENCOUNTER — Ambulatory Visit: Payer: Medicare Other | Admitting: Sports Medicine

## 2015-05-10 ENCOUNTER — Ambulatory Visit (INDEPENDENT_AMBULATORY_CARE_PROVIDER_SITE_OTHER): Payer: Medicare Other | Admitting: Sports Medicine

## 2015-05-10 ENCOUNTER — Encounter: Payer: Self-pay | Admitting: Sports Medicine

## 2015-05-10 DIAGNOSIS — B351 Tinea unguium: Secondary | ICD-10-CM

## 2015-05-10 DIAGNOSIS — M79676 Pain in unspecified toe(s): Secondary | ICD-10-CM

## 2015-05-10 NOTE — Progress Notes (Signed)
Patient ID: Arthur Blair, male   DOB: 1916-02-04, 82 y.o.   MRN: [id number] Subjective: Arthur Blair is a 82 y.o. male patient seen today in office with complaint of painful thickened and elongated toenails; unable to trim. Patient denies history of Diabetes, Neuropathy, or Vascular disease. Patient has no other pedal complaints at this time.   Patient Active Problem List   Diagnosis Date Noted  . Tongue cancer ([place]) 04/03/2015  . Accelerated hypertension 04/03/2015   Current Outpatient Prescriptions on File Prior to Visit  Medication Sig Dispense Refill  . amLODipine (NORVASC) 5 MG tablet Take 1 tablet (5 mg total) by mouth daily. 30 tablet 0  . amoxicillin (AMOXIL) 400 MG/5ML suspension 2-1/4 teaspoons by mouth twice daily for 10 days 250 mL 0  . docusate sodium (COLACE) 100 MG capsule Take 1 capsule (100 mg total) by mouth 2 (two) times daily. 10 capsule 0  . fluticasone (FLONASE) 50 MCG/ACT nasal spray Place 2 sprays into both nostrils daily.     . furosemide (LASIX) 20 MG tablet Take 10 mg by mouth daily.     Marland Kitchen HYDROcodone-acetaminophen (HYCET) 7.5-325 mg/15 ml solution 10-15 cc by mouth every 4-6 hours as needed for pain 300 mL 0  . magnesium gluconate (MAGONATE) 500 MG tablet Take 500 mg by mouth daily.    . metoprolol succinate (TOPROL-XL) 25 MG 24 hr tablet Take 25 mg by mouth 2 (two) times daily.     . Multiple Vitamin (MULTIVITAMIN) tablet Take 1 tablet by mouth daily.    Marland Kitchen omeprazole (PRILOSEC) 20 MG capsule Take 20 mg by mouth daily.    . Probiotic Product (PROBIOTIC ACIDOPHILUS) CAPS Take 1 capsule by mouth daily.     No current facility-administered medications on file prior to visit.   Allergies  Allergen Reactions  . Sulfa Antibiotics Nausea And Vomiting    "pain in torso"     Objective: Physical Exam  General: Well developed, nourished, no acute distress, awake, alert and oriented x 3. Rolling Walker assisted gait  Vascular: Dorsalis pedis artery 1/4  bilateral, Posterior tibial artery 1/4 bilateral, skin temperature warm to warm proximal to distal bilateral lower extremities, + varicosities, scant pedal hair present bilateral.  Neurological: Gross sensation present via light touch bilateral.   Dermatological: Skin is warm, dry, and supple bilateral, Nails 1-10 are tender, long, thick, and discolored with mild subungal debris, no webspace macerations present bilateral, no open lesions present bilateral, no callus/corns/hyperkeratotic tissue present bilateral. No signs of infection bilateral.  Musculoskeletal: No gross boney deformities noted bilateral. Muscular strength within normal limits without pain or limitation on range of motion. No pain with calf compression bilateral.  Assessment and Plan:  Problem List Items Addressed This Visit    None    Visit Diagnoses    Dermatophytosis of nail    -  Primary    Pain of toe, unspecified laterality          -Examined patient.  -Discussed treatment options for painful mycotic nails. -Mechanically debrided and reduced mycotic nails with sterile nail nipper and dremel nail file without incident. -Recommend continue with good supportive shoes daily and ambulation with rolling walker for stability. -Patient to return in 3 months for follow up evaluation or sooner if symptoms worsen.  Landis Martins, DPM

## 2015-05-13 ENCOUNTER — Encounter (INDEPENDENT_AMBULATORY_CARE_PROVIDER_SITE_OTHER): Payer: Medicare Other | Admitting: Ophthalmology

## 2015-05-13 DIAGNOSIS — H43813 Vitreous degeneration, bilateral: Secondary | ICD-10-CM

## 2015-05-13 DIAGNOSIS — H35033 Hypertensive retinopathy, bilateral: Secondary | ICD-10-CM

## 2015-05-13 DIAGNOSIS — H353221 Exudative age-related macular degeneration, left eye, with active choroidal neovascularization: Secondary | ICD-10-CM | POA: Diagnosis not present

## 2015-05-13 DIAGNOSIS — D3132 Benign neoplasm of left choroid: Secondary | ICD-10-CM

## 2015-05-13 DIAGNOSIS — H353112 Nonexudative age-related macular degeneration, right eye, intermediate dry stage: Secondary | ICD-10-CM

## 2015-05-13 DIAGNOSIS — I10 Essential (primary) hypertension: Secondary | ICD-10-CM

## 2015-06-10 ENCOUNTER — Encounter (INDEPENDENT_AMBULATORY_CARE_PROVIDER_SITE_OTHER): Payer: Medicare Other | Admitting: Ophthalmology

## 2015-06-11 ENCOUNTER — Encounter (INDEPENDENT_AMBULATORY_CARE_PROVIDER_SITE_OTHER): Payer: Medicare Other | Admitting: Ophthalmology

## 2015-06-11 DIAGNOSIS — H353221 Exudative age-related macular degeneration, left eye, with active choroidal neovascularization: Secondary | ICD-10-CM | POA: Diagnosis not present

## 2015-06-11 DIAGNOSIS — I10 Essential (primary) hypertension: Secondary | ICD-10-CM | POA: Diagnosis not present

## 2015-06-11 DIAGNOSIS — H353112 Nonexudative age-related macular degeneration, right eye, intermediate dry stage: Secondary | ICD-10-CM

## 2015-06-11 DIAGNOSIS — H43813 Vitreous degeneration, bilateral: Secondary | ICD-10-CM

## 2015-06-11 DIAGNOSIS — D3132 Benign neoplasm of left choroid: Secondary | ICD-10-CM | POA: Diagnosis not present

## 2015-06-11 DIAGNOSIS — H35033 Hypertensive retinopathy, bilateral: Secondary | ICD-10-CM

## 2015-07-08 DEATH — deceased

## 2015-07-09 ENCOUNTER — Encounter (INDEPENDENT_AMBULATORY_CARE_PROVIDER_SITE_OTHER): Payer: Medicare Other | Admitting: Ophthalmology

## 2015-07-09 DIAGNOSIS — H353112 Nonexudative age-related macular degeneration, right eye, intermediate dry stage: Secondary | ICD-10-CM

## 2015-07-09 DIAGNOSIS — D3132 Benign neoplasm of left choroid: Secondary | ICD-10-CM | POA: Diagnosis not present

## 2015-07-09 DIAGNOSIS — H35033 Hypertensive retinopathy, bilateral: Secondary | ICD-10-CM

## 2015-07-09 DIAGNOSIS — H43813 Vitreous degeneration, bilateral: Secondary | ICD-10-CM

## 2015-07-09 DIAGNOSIS — H353221 Exudative age-related macular degeneration, left eye, with active choroidal neovascularization: Secondary | ICD-10-CM

## 2015-07-09 DIAGNOSIS — I10 Essential (primary) hypertension: Secondary | ICD-10-CM

## 2015-08-06 ENCOUNTER — Encounter (INDEPENDENT_AMBULATORY_CARE_PROVIDER_SITE_OTHER): Payer: Medicare Other | Admitting: Ophthalmology

## 2015-08-06 DIAGNOSIS — H353221 Exudative age-related macular degeneration, left eye, with active choroidal neovascularization: Secondary | ICD-10-CM | POA: Diagnosis not present

## 2015-08-06 DIAGNOSIS — H43813 Vitreous degeneration, bilateral: Secondary | ICD-10-CM | POA: Diagnosis not present

## 2015-08-06 DIAGNOSIS — H353112 Nonexudative age-related macular degeneration, right eye, intermediate dry stage: Secondary | ICD-10-CM

## 2015-08-06 DIAGNOSIS — H35033 Hypertensive retinopathy, bilateral: Secondary | ICD-10-CM | POA: Diagnosis not present

## 2015-08-06 DIAGNOSIS — I10 Essential (primary) hypertension: Secondary | ICD-10-CM

## 2015-08-06 DIAGNOSIS — D3132 Benign neoplasm of left choroid: Secondary | ICD-10-CM | POA: Diagnosis not present

## 2015-08-13 ENCOUNTER — Encounter: Payer: Self-pay | Admitting: Sports Medicine

## 2015-08-13 ENCOUNTER — Ambulatory Visit (INDEPENDENT_AMBULATORY_CARE_PROVIDER_SITE_OTHER): Payer: Medicare Other | Admitting: Sports Medicine

## 2015-08-13 DIAGNOSIS — B351 Tinea unguium: Secondary | ICD-10-CM

## 2015-08-13 DIAGNOSIS — M79676 Pain in unspecified toe(s): Secondary | ICD-10-CM | POA: Diagnosis not present

## 2015-08-13 DIAGNOSIS — I739 Peripheral vascular disease, unspecified: Secondary | ICD-10-CM

## 2015-08-13 NOTE — Progress Notes (Signed)
Patient ID: Arthur Blair, male   DOB: February 13, 1916, 82 y.o.   MRN: [id number]  Subjective: Arthur Blair is a 82 y.o. male patient seen today in office with complaint of painful thickened and elongated toenails; unable to trim. Patient denies changes with medical history since last visit. Patient has no other pedal complaints at this time.   Patient Active Problem List   Diagnosis Date Noted  . Tongue cancer ([place]) 04/03/2015  . Accelerated hypertension 04/03/2015  . Beat, premature ventricular 09/05/2014  . H/O malignant neoplasm of prostate 05/11/2014  . H/O tongue cancer 05/11/2014   Current Outpatient Prescriptions on File Prior to Visit  Medication Sig Dispense Refill  . amLODipine (NORVASC) 5 MG tablet Take 1 tablet (5 mg total) by mouth daily. 30 tablet 0  . amoxicillin (AMOXIL) 400 MG/5ML suspension 2-1/4 teaspoons by mouth twice daily for 10 days 250 mL 0  . docusate sodium (COLACE) 100 MG capsule Take 1 capsule (100 mg total) by mouth 2 (two) times daily. 10 capsule 0  . fluticasone (FLONASE) 50 MCG/ACT nasal spray Place 2 sprays into both nostrils daily.     . furosemide (LASIX) 20 MG tablet Take 10 mg by mouth daily.     Marland Kitchen HYDROcodone-acetaminophen (HYCET) 7.5-325 mg/15 ml solution 10-15 cc by mouth every 4-6 hours as needed for pain 300 mL 0  . magnesium gluconate (MAGONATE) 500 MG tablet Take 500 mg by mouth daily.    . metoprolol succinate (TOPROL-XL) 25 MG 24 hr tablet Take 25 mg by mouth 2 (two) times daily.     . Multiple Vitamin (MULTIVITAMIN) tablet Take 1 tablet by mouth daily.    Marland Kitchen omeprazole (PRILOSEC) 20 MG capsule Take 20 mg by mouth daily.    . Probiotic Product (PROBIOTIC ACIDOPHILUS) CAPS Take 1 capsule by mouth daily.     No current facility-administered medications on file prior to visit.   Allergies  Allergen Reactions  . Sulfa Antibiotics Nausea And Vomiting    "pain in torso"     Objective: Physical Exam  General: Well developed, nourished, no  acute distress, awake, alert and oriented x 3. Rolling Walker assisted gait  Vascular: Dorsalis pedis artery 1/4 bilateral, Posterior tibial artery 1/4 bilateral, skin temperature warm to warm proximal to distal bilateral lower extremities, + varicosities, scant pedal hair present bilateral.  Neurological: Gross sensation present via light touch bilateral.   Dermatological: Skin is warm, dry, and supple bilateral, Nails 1-10 are tender, long, thick, and discolored with mild subungal debris, no webspace macerations present bilateral, no open lesions present bilateral, no callus/corns/hyperkeratotic tissue present bilateral. No signs of infection bilateral.  Musculoskeletal: No gross boney deformities noted bilateral. Muscular strength within normal limits without pain or limitation on range of motion. No pain with calf compression bilateral.  Assessment and Plan:  Problem List Items Addressed This Visit    None    Visit Diagnoses    Dermatophytosis of nail    -  Primary    Pain of toe, unspecified laterality        PVD (peripheral vascular disease) ([place])          -Examined patient.  -Discussed treatment options for painful mycotic nails. -Mechanically debrided and reduced mycotic nails with sterile nail nipper and dremel nail file without incident. -Recommend continue with good supportive shoes and socks daily and ambulation with rolling walker for stability. -Patient to return in 3 months for follow up evaluation or sooner if symptoms worsen.  Bricyn Labrada  Cannon Kettle, DPM

## 2015-08-27 DIAGNOSIS — K520 Gastroenteritis and colitis due to radiation: Secondary | ICD-10-CM

## 2015-08-27 DIAGNOSIS — K219 Gastro-esophageal reflux disease without esophagitis: Secondary | ICD-10-CM

## 2015-08-27 DIAGNOSIS — D692 Other nonthrombocytopenic purpura: Secondary | ICD-10-CM

## 2015-08-27 DIAGNOSIS — G629 Polyneuropathy, unspecified: Secondary | ICD-10-CM

## 2015-08-27 DIAGNOSIS — I10 Essential (primary) hypertension: Secondary | ICD-10-CM

## 2015-08-27 DIAGNOSIS — C029 Malignant neoplasm of tongue, unspecified: Secondary | ICD-10-CM | POA: Diagnosis not present

## 2015-09-02 ENCOUNTER — Other Ambulatory Visit: Payer: Self-pay | Admitting: Otolaryngology

## 2015-09-02 DIAGNOSIS — R221 Localized swelling, mass and lump, neck: Secondary | ICD-10-CM

## 2015-09-02 DIAGNOSIS — Z85810 Personal history of malignant neoplasm of tongue: Secondary | ICD-10-CM

## 2015-09-06 ENCOUNTER — Ambulatory Visit
Admission: RE | Admit: 2015-09-06 | Discharge: 2015-09-06 | Disposition: A | Discharge status: Home / Self Care | Payer: Medicare Other | Source: Ambulatory Visit | Attending: Otolaryngology | Admitting: Otolaryngology

## 2015-09-06 DIAGNOSIS — R221 Localized swelling, mass and lump, neck: Secondary | ICD-10-CM

## 2015-09-06 DIAGNOSIS — Z85810 Personal history of malignant neoplasm of tongue: Secondary | ICD-10-CM

## 2015-09-11 ENCOUNTER — Ambulatory Visit: Payer: Medicare Other

## 2015-09-11 ENCOUNTER — Ambulatory Visit
Admission: RE | Admit: 2015-09-11 | Discharge: 2015-09-11 | Disposition: A | Discharge status: Home / Self Care | Payer: Medicare Other | Source: Ambulatory Visit | Attending: Otolaryngology | Admitting: Otolaryngology

## 2015-09-11 DIAGNOSIS — I700 Atherosclerosis of aorta: Secondary | ICD-10-CM | POA: Diagnosis not present

## 2015-09-11 DIAGNOSIS — R221 Localized swelling, mass and lump, neck: Secondary | ICD-10-CM | POA: Insufficient documentation

## 2015-09-11 DIAGNOSIS — N289 Disorder of kidney and ureter, unspecified: Secondary | ICD-10-CM | POA: Diagnosis not present

## 2015-09-11 DIAGNOSIS — R938 Abnormal findings on diagnostic imaging of other specified body structures: Secondary | ICD-10-CM | POA: Diagnosis not present

## 2015-09-11 DIAGNOSIS — I2510 Atherosclerotic heart disease of native coronary artery without angina pectoris: Secondary | ICD-10-CM | POA: Insufficient documentation

## 2015-09-11 DIAGNOSIS — Z85810 Personal history of malignant neoplasm of tongue: Secondary | ICD-10-CM | POA: Diagnosis present

## 2015-09-11 DIAGNOSIS — R918 Other nonspecific abnormal finding of lung field: Secondary | ICD-10-CM | POA: Diagnosis not present

## 2015-09-11 LAB — GLUCOSE, CAPILLARY: Glucose-Capillary: 103 mg/dL — ABNORMAL HIGH (ref 65–99)

## 2015-09-11 MED ORDER — FLUDEOXYGLUCOSE F - 18 (FDG) INJECTION
12.2600 | Freq: Once | INTRAVENOUS | Status: AC | PRN
  Administered 2015-09-11: 12.26 via INTRAVENOUS

## 2015-09-16 ENCOUNTER — Encounter (INDEPENDENT_AMBULATORY_CARE_PROVIDER_SITE_OTHER): Payer: Medicare Other | Admitting: Ophthalmology

## 2015-09-16 DIAGNOSIS — D3132 Benign neoplasm of left choroid: Secondary | ICD-10-CM

## 2015-09-16 DIAGNOSIS — H353112 Nonexudative age-related macular degeneration, right eye, intermediate dry stage: Secondary | ICD-10-CM | POA: Diagnosis not present

## 2015-09-16 DIAGNOSIS — H43813 Vitreous degeneration, bilateral: Secondary | ICD-10-CM | POA: Diagnosis not present

## 2015-09-16 DIAGNOSIS — I10 Essential (primary) hypertension: Secondary | ICD-10-CM | POA: Diagnosis not present

## 2015-09-16 DIAGNOSIS — H35033 Hypertensive retinopathy, bilateral: Secondary | ICD-10-CM | POA: Diagnosis not present

## 2015-09-16 DIAGNOSIS — H353221 Exudative age-related macular degeneration, left eye, with active choroidal neovascularization: Secondary | ICD-10-CM | POA: Diagnosis not present

## 2015-09-17 DIAGNOSIS — G9009 Other idiopathic peripheral autonomic neuropathy: Secondary | ICD-10-CM

## 2015-09-17 DIAGNOSIS — I10 Essential (primary) hypertension: Secondary | ICD-10-CM

## 2015-09-17 DIAGNOSIS — K520 Gastroenteritis and colitis due to radiation: Secondary | ICD-10-CM

## 2015-09-17 DIAGNOSIS — K219 Gastro-esophageal reflux disease without esophagitis: Secondary | ICD-10-CM

## 2015-09-17 DIAGNOSIS — C029 Malignant neoplasm of tongue, unspecified: Secondary | ICD-10-CM

## 2015-09-20 ENCOUNTER — Encounter: Payer: Self-pay | Admitting: Radiation Oncology

## 2015-09-20 ENCOUNTER — Ambulatory Visit
Admission: RE | Admit: 2015-09-20 | Discharge: 2015-09-20 | Disposition: A | Discharge status: Home / Self Care | Payer: Medicare Other | Source: Ambulatory Visit | Attending: Radiation Oncology | Admitting: Radiation Oncology

## 2015-09-20 VITALS — BP 123/69 | HR 68 | Temp 98.0°F | Resp 20 | Wt 177.2 lb

## 2015-09-20 DIAGNOSIS — C021 Malignant neoplasm of border of tongue: Secondary | ICD-10-CM | POA: Insufficient documentation

## 2015-09-20 DIAGNOSIS — Z09 Encounter for follow-up examination after completed treatment for conditions other than malignant neoplasm: Secondary | ICD-10-CM | POA: Diagnosis not present

## 2015-09-20 DIAGNOSIS — Z7982 Long term (current) use of aspirin: Secondary | ICD-10-CM | POA: Diagnosis not present

## 2015-09-20 DIAGNOSIS — I10 Essential (primary) hypertension: Secondary | ICD-10-CM | POA: Diagnosis not present

## 2015-09-20 DIAGNOSIS — K219 Gastro-esophageal reflux disease without esophagitis: Secondary | ICD-10-CM | POA: Diagnosis not present

## 2015-09-20 DIAGNOSIS — Z510 Encounter for antineoplastic radiation therapy: Secondary | ICD-10-CM | POA: Diagnosis present

## 2015-09-20 DIAGNOSIS — Z79899 Other long term (current) drug therapy: Secondary | ICD-10-CM | POA: Diagnosis not present

## 2015-09-20 DIAGNOSIS — M1990 Unspecified osteoarthritis, unspecified site: Secondary | ICD-10-CM | POA: Insufficient documentation

## 2015-09-20 DIAGNOSIS — K589 Irritable bowel syndrome without diarrhea: Secondary | ICD-10-CM | POA: Insufficient documentation

## 2015-09-20 DIAGNOSIS — C029 Malignant neoplasm of tongue, unspecified: Secondary | ICD-10-CM

## 2015-09-20 NOTE — Consult Note (Signed)
Except an outstanding is perfect of Radiation Oncology NEW PATIENT EVALUATION  Name: Arthur Blair  MRN: [id number]  Date:   09/20/2015     DOB: 1916/02/08   This 82 y.o. 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  REFERRING PHYSICIAN: Rusty Aus, MD  CHIEF COMPLAINT:  Chief Complaint  Patient presents with  . Cancer    Pt is an [facility] referral for Tongue cancer.      DIAGNOSIS: The encounter diagnosis was Tongue cancer ([place]).   PREVIOUS INVESTIGATIONS:  PET CT scan reviewed Previous pathology reports reviewed Clinical notes reviewed  HPI: Patient is a 82 year old male well known to our department having been treated back in 2014 to his left lateral tongue for invasive squamous cell carcinoma status post resection with positive margins. He is had several resections in this area the last one being back in January 2017 again for recurrent squamous cell carcinoma with left partial glossectomy. He recently presented with increasing oral pain headaches and left facial pain. He's been noted by ENT to have an ulcerative lesion again in the same region of the left lateral tongue. Repeat PET CT scan 09/11/2015 shows a large hypermetabolic mass in the left side of the tongue extending towards the left angle the mandible. He also has a small subcentimeter hypermetabolic lymph node at level II. He has no evidence of distant disease. Patient started has some difficulty swallowing and his significant problems with pain. I've asked to evaluate him for possible further salvage radiation.  PLANNED TREATMENT REGIMEN: Salvage radiation therapy using IMRT  PAST MEDICAL HISTORY:  has a past medical history of Arthritis; Hypertension; Shortness of breath dyspnea; Claustrophobia; GERD (gastroesophageal reflux disease); Cancer ([place]); IBS (irritable bowel syndrome); Involuntary movements; and Macular degeneration, left eye.    PAST  SURGICAL HISTORY:  Past Surgical History  Procedure Laterality Date  . Nasal septum surgery    . Joint replacement Left     Total Hip Replacement  . Eye surgery Bilateral     Cataract Extraction with IOL  . Excision of tongue lesion N/A 04/03/2015    Procedure: EXCISION OF TONGUE LESION, PARTIAL GLOSSECTOMY;  Surgeon: Clyde Canterbury, MD;  Location: [facility] ORS;  Service: ENT;  Laterality: N/A;    FAMILY HISTORY: family history includes COPD in his sister; Hypertension in his mother.  SOCIAL HISTORY:  reports that he has quit smoking. His smoking use included Cigarettes. He smoked 1.00 pack per day. He has never used smokeless tobacco. He reports that he does not drink alcohol or use illicit drugs.  ALLERGIES: Sulfa antibiotics  MEDICATIONS:  Current Outpatient Prescriptions  Medication Sig Dispense Refill  . aspirin EC 81 MG tablet Take by mouth.    . bimatoprost (LUMIGAN) 0.01 % SOLN Place 1 drop into both eyes at bedtime.    . cholestyramine (QUESTRAN) 4 g packet Take 4 g by mouth 3 (three) times daily with meals.    . docusate sodium (COLACE) 100 MG capsule Take 1 capsule (100 mg total) by mouth 2 (two) times daily. 10 capsule 0  . fluticasone (FLONASE) 50 MCG/ACT nasal spray Place into the nose.    . Multiple Vitamin (MULTIVITAMIN) tablet Take 1 tablet by mouth daily.    Marland Kitchen omeprazole (PRILOSEC) 20 MG capsule Take 20 mg by mouth daily.    . Probiotic Product (ALIGN) 4 MG CAPS Take by mouth.    . Probiotic Product (PROBIOTIC ACIDOPHILUS) CAPS Take  1 capsule by mouth daily.    . traMADol (ULTRAM) 50 MG tablet Take by mouth every 8 (eight) hours as needed.    Marland Kitchen amLODipine (NORVASC) 5 MG tablet Take 1 tablet (5 mg total) by mouth daily. (Patient not taking: Reported on 09/20/2015) 30 tablet 0  . amoxicillin (AMOXIL) 400 MG/5ML suspension 2-1/4 teaspoons by mouth twice daily for 10 days (Patient not taking: Reported on 09/20/2015) 250 mL 0  . furosemide (LASIX) 20 MG tablet Take 10 mg by mouth  daily. Reported on 09/20/2015    . HYDROcodone-acetaminophen (HYCET) 7.5-325 mg/15 ml solution 10-15 cc by mouth every 4-6 hours as needed for pain (Patient not taking: Reported on 09/20/2015) 300 mL 0  . magnesium gluconate (MAGONATE) 500 MG tablet Take 500 mg by mouth daily. Reported on 09/20/2015    . metoprolol succinate (TOPROL-XL) 25 MG 24 hr tablet Take 25 mg by mouth 2 (two) times daily. Reported on 09/20/2015     No current facility-administered medications for this encounter.    ECOG PERFORMANCE STATUS:  2 - Symptomatic, <50% confined to bed  REVIEW OF SYSTEMS: Except for the oral pain and discomfort and difficulty swallowing Patient denies any weight loss, fatigue, weakness, fever, chills or night sweats. Patient denies any loss of vision, blurred vision. Patient denies any ringing  of the ears or hearing loss. No irregular heartbeat. Patient denies heart murmur or history of fainting. Patient denies any chest pain or pain radiating to her upper extremities. Patient denies any shortness of breath, difficulty breathing at night, cough or hemoptysis. Patient denies any swelling in the lower legs. Patient denies any nausea vomiting, vomiting of blood, or coffee ground material in the vomitus. Patient denies any stomach pain. Patient states has had normal bowel movements no significant constipation or diarrhea. Patient denies any dysuria, hematuria or significant nocturia. Patient denies any problems walking, swelling in the joints or loss of balance. Patient denies any skin changes, loss of hair or loss of weight. Patient denies any excessive worrying or anxiety or significant depression. Patient denies any problems with insomnia. Patient denies excessive thirst, polyuria, polydipsia. Patient denies any swollen glands, patient denies easy bruising or easy bleeding. Patient denies any recent infections, allergies or URI. Patient "s visual fields have not changed significantly in recent time.     PHYSICAL EXAM: BP 123/69 mmHg  Pulse 68  Temp(Src) 98 F (36.7 C)  Resp 20  Wt 177 lb 4 oz (80.4 kg) Oral cavity shows large ulcerative lesion in the left lateral tongue with some tethering to the floor of mouth. Patient does have some slight trismus. Upper airways clear no evidence of subject gastric cervical or supraclavicular adenopathy is appreciated. Well-developed well-nourished patient in NAD. HEENT reveals PERLA, EOMI, discs not visualized.  Oral cavity is clear. No oral mucosal lesions are identified. Neck is clear without evidence of cervical or supraclavicular adenopathy. Lungs are clear to A&P. Cardiac examination is essentially unremarkable with regular rate and rhythm without murmur rub or thrill. Abdomen is benign with no organomegaly or masses noted. Motor sensory and DTR levels are equal and symmetric in the upper and lower extremities. Cranial nerves II through XII are grossly intact. Proprioception is intact. No peripheral adenopathy or edema is identified. No motor or sensory levels are noted. Crude visual fields are within normal range.  LABORATORY DATA: Previous pathology is reviewed    RADIOLOGY RESULTS: Recent PET CT scan is reviewed and compatible with the above-stated findings   IMPRESSION: Recurrent  squamous cell carcinoma the left lateral tongue and 82 year old male  PLAN: At this time I believe I can offer palliative radiation therapy using IM RT to his left lateral tongue. Would not treat any low neck lesions or nodes since I'm treating and incompletely palliative manner to prevent further pain and discomfort and difficulty with swallowing. I've explained the risks and benefits of free radiating this area although I believe with concise I MRT fields I can decrease his side effect profile considerably and achieve some good palliation in this region. I've also set the patient up with medical oncology for consideration of Erbitux. Risks and benefits of treatment  including increased xerostomia skin reaction fatigue and possible dysphasia all were discussed in detail with the patient. I've also had a phone conversation with the patient's son and daughter-in-law. They were all in agreement to try some palliative radiation therapy at this time. Another option is hospice care and again that will be discussed with medical oncology. I personally set up and ordered CT simulation for next week. I've explained to his son that we can stop the treatments at any time should the side effects become problematic.  I would like to take this opportunity to thank you for allowing me to participate in the care of your patient.Armstead Peaks., MD

## 2015-09-24 ENCOUNTER — Inpatient Hospital Stay: Payer: Medicare Other | Attending: Oncology | Admitting: Oncology

## 2015-09-24 ENCOUNTER — Ambulatory Visit
Admission: RE | Admit: 2015-09-24 | Discharge: 2015-09-24 | Disposition: A | Discharge status: Home / Self Care | Payer: Medicare Other | Source: Ambulatory Visit | Attending: Radiation Oncology | Admitting: Radiation Oncology

## 2015-09-24 ENCOUNTER — Inpatient Hospital Stay: Payer: Medicare Other

## 2015-09-24 VITALS — BP 96/57 | HR 93 | Temp 96.1°F | Resp 18 | Wt 177.1 lb

## 2015-09-24 DIAGNOSIS — H3530 Unspecified macular degeneration: Secondary | ICD-10-CM | POA: Insufficient documentation

## 2015-09-24 DIAGNOSIS — Z87891 Personal history of nicotine dependence: Secondary | ICD-10-CM | POA: Insufficient documentation

## 2015-09-24 DIAGNOSIS — Z85810 Personal history of malignant neoplasm of tongue: Secondary | ICD-10-CM | POA: Diagnosis not present

## 2015-09-24 DIAGNOSIS — K219 Gastro-esophageal reflux disease without esophagitis: Secondary | ICD-10-CM | POA: Insufficient documentation

## 2015-09-24 DIAGNOSIS — Z923 Personal history of irradiation: Secondary | ICD-10-CM

## 2015-09-24 DIAGNOSIS — Z7982 Long term (current) use of aspirin: Secondary | ICD-10-CM | POA: Insufficient documentation

## 2015-09-24 DIAGNOSIS — Z79899 Other long term (current) drug therapy: Secondary | ICD-10-CM | POA: Diagnosis not present

## 2015-09-24 DIAGNOSIS — Z510 Encounter for antineoplastic radiation therapy: Secondary | ICD-10-CM | POA: Diagnosis not present

## 2015-09-24 DIAGNOSIS — R5381 Other malaise: Secondary | ICD-10-CM | POA: Diagnosis not present

## 2015-09-24 DIAGNOSIS — M1990 Unspecified osteoarthritis, unspecified site: Secondary | ICD-10-CM | POA: Diagnosis not present

## 2015-09-24 DIAGNOSIS — K589 Irritable bowel syndrome without diarrhea: Secondary | ICD-10-CM | POA: Insufficient documentation

## 2015-09-24 DIAGNOSIS — R531 Weakness: Secondary | ICD-10-CM | POA: Insufficient documentation

## 2015-09-24 DIAGNOSIS — R630 Anorexia: Secondary | ICD-10-CM | POA: Insufficient documentation

## 2015-09-24 DIAGNOSIS — Z5112 Encounter for antineoplastic immunotherapy: Secondary | ICD-10-CM | POA: Diagnosis not present

## 2015-09-24 DIAGNOSIS — R5383 Other fatigue: Secondary | ICD-10-CM | POA: Insufficient documentation

## 2015-09-24 DIAGNOSIS — C01 Malignant neoplasm of base of tongue: Secondary | ICD-10-CM | POA: Insufficient documentation

## 2015-09-24 DIAGNOSIS — I10 Essential (primary) hypertension: Secondary | ICD-10-CM | POA: Diagnosis not present

## 2015-09-24 NOTE — Progress Notes (Signed)
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  INTERVAL HISTORY: Patient is a 82 year old male who underwent radiation and surgery for base of tongue cancer approximately 3 years ago. Patient recently presented with increasing mouth pain. He was evaluated by ENT and noted to have an ulcerative lesion at the base of his left lateral tongue that was consistent with squamous cell carcinoma. Patient also had a PET scan confirming the diagnosis. Currently, he otherwise feels well and is asymptomatic. He has no neurologic complaint. He denies any recent fevers or illnesses. He has no chest pain or shortness of breath. He has a fair appetite, but denies weight loss. He has no nausea, vomiting, constipation, or diarrhea. He has no urinary complaints. Patient otherwise feels well and offers no further specific complaints.  REVIEW OF SYSTEMS:   Review of Systems  Constitutional: Negative.  Negative for fever, weight loss and malaise/fatigue.  HENT: Negative.  Negative for sore throat.   Respiratory: Negative.  Negative for cough and shortness of breath.   Cardiovascular: Negative.  Negative for chest pain.  Gastrointestinal: Negative.  Negative for abdominal pain.  Genitourinary: Negative.   Musculoskeletal: Negative.   Neurological: Negative.  Negative for weakness.  Psychiatric/Behavioral: Negative.     As per HPI. Otherwise, a complete review of systems is negatve.  PAST MEDICAL HISTORY: Past Medical History  Diagnosis Date  . Arthritis   . Hypertension   . Shortness of breath dyspnea     with exertion  . Claustrophobia   . GERD (gastroesophageal reflux disease)   . Cancer [facility])     prostate  . IBS (irritable bowel syndrome)   . Involuntary  movements     Legs  . Macular degeneration, left eye     PAST SURGICAL HISTORY: Past Surgical History  Procedure Laterality Date  . Nasal septum surgery    . Joint replacement Left     Total Hip Replacement  . Eye surgery Bilateral     Cataract Extraction with IOL  . Excision of tongue lesion N/A 04/03/2015    Procedure: EXCISION OF TONGUE LESION, PARTIAL GLOSSECTOMY;  Surgeon: Clyde Canterbury, MD;  Location: [facility] ORS;  Service: ENT;  Laterality: N/A;    FAMILY HISTORY: Family History  Problem Relation Age of Onset  . Hypertension Mother   . COPD Sister        ADVANCED DIRECTIVES:    HEALTH MAINTENANCE: Social History  Substance Use Topics  . Smoking status: Former Smoker -- 1.00 packs/day    Types: Cigarettes  . Smokeless tobacco: Never Used     Comment: QUIT 40 YEARS AGO  . Alcohol Use: No     Colonoscopy:  PAP:  Bone density:  Lipid panel:  Allergies  Allergen Reactions  . Sulfa Antibiotics Nausea And Vomiting    "pain in torso"    Current Outpatient Prescriptions  Medication Sig Dispense Refill  . aspirin EC 81 MG tablet Take by mouth.    . bimatoprost (LUMIGAN) 0.01 % SOLN Place 1 drop into both eyes at bedtime.    . cholestyramine (QUESTRAN) 4 g packet Take 4 g by mouth 3 (three) times daily with meals.    . fluconazole (DIFLUCAN) 150 MG tablet Take 150 mg by mouth daily. For 14 days    .  fluticasone (FLONASE) 50 MCG/ACT nasal spray Place into the nose.    . Multiple Vitamin (MULTIVITAMIN) tablet Take 1 tablet by mouth daily.    Marland Kitchen omeprazole (PRILOSEC) 20 MG capsule Take 20 mg by mouth daily.    . Probiotic Product (ALIGN) 4 MG CAPS Take 1 capsule by mouth daily.    . traMADol (ULTRAM) 50 MG tablet Take by mouth every 8 (eight) hours as needed.     No current facility-administered medications for this visit.    OBJECTIVE: Filed Vitals:   09/24/15 0955  BP: 96/57  Pulse: 93  Temp: 96.1 F (35.6 C)  Resp: 18     Body mass index is 24.02  kg/(m^2).    ECOG FS:0 - Asymptomatic  General: Well-developed, well-nourished, no acute distress. Eyes: Pink conjunctiva, anicteric sclera. HEENT: Lesion at base of tongue difficult to visualize. No palpable lymphadenopathy.  Lungs: Clear to auscultation bilaterally. Heart: Regular rate and rhythm. No rubs, murmurs, or gallops. Abdomen: Soft, nontender, nondistended. No organomegaly noted, normoactive bowel sounds. Musculoskeletal: No edema, cyanosis, or clubbing. Neuro: Alert, answering all questions appropriately. Cranial nerves grossly intact. Skin: No rashes or petechiae noted. Psych: Normal affect.   LAB RESULTS:  Lab Results  Component Value Date   NA 139 04/01/2015   K 4.7 04/01/2015   CL 104 04/01/2015   CO2 29 04/01/2015   GLUCOSE 88 04/01/2015   BUN 32* 04/01/2015   CREATININE 1.06 04/01/2015   CALCIUM 9.0 04/01/2015   PROT 7.3 10/03/2012   ALBUMIN 4.2 10/03/2012   AST 18 10/03/2012   ALT 19 10/03/2012   ALKPHOS 83 10/03/2012   BILITOT 0.5 10/03/2012   GFRNONAA 57* 04/01/2015   GFRAA >60 04/01/2015    Lab Results  Component Value Date   WBC 5.0 04/01/2015   NEUTROABS 3.7 12/16/2012   HGB 12.2* 04/01/2015   HCT 36.3* 04/01/2015   MCV 88.4 04/01/2015   PLT 169 04/01/2015     STUDIES: Nm Pet Image Initial (pi) Skull Base To Thigh  09/13/2015  CLINICAL DATA:  Subsequent treatment strategy for head and neck carcinoma. EXAM: NUCLEAR MEDICINE PET SKULL BASE TO THIGH TECHNIQUE: 12.26 mCi F-18 FDG was injected intravenously. Full-ring PET imaging was performed from the skull base to thigh after the radiotracer. CT data was obtained and used for attenuation correction and anatomic localization. FASTING BLOOD GLUCOSE:  Value: 103 mg/dl COMPARISON:  10/10/2012 FINDINGS: NECK There is a large focus of intense radiotracer uptake involving the left side of tongue. This measures 5.5 cm in length, 4.4 cm AP and 4.1 cm transverse. SUV max associated with this mass is equal to  17.48. The tumor appears to extend posteriorly to the level of the angle of jaw. There is a sub cm lymph node within the right level 2 region which exhibits malignant range FDG uptake within SUV max equal to 4.4. Heterogeneous uptake is identified within both lobes of the thyroid gland. CHEST Normal heart size. There is aortic atherosclerosis. The transverse aortic arch measures 5 cm. Calcification involving the LAD coronary artery noted. No hypermetabolic mediastinal or hilar lymph nodes. No axillary or supraclavicular hypermetabolic adenopathy. No pleural fluid. Bilateral calcified pleural plaques are noted compatible with prior asbestos exposure. There is no suspicious pulmonary nodule or mass identified. ABDOMEN/PELVIS No abnormal hypermetabolic activity within the liver, pancreas, adrenal glands, or spleen. There is a cyst within the inferior pole of the right kidney. Calcifications are identified within the wall of the cyst. There is an adjacent hyperdense  structure measuring 1.4 cm and 44 HU. No hypermetabolic lymph nodes identified within the upper abdomen. No hypermetabolic pelvic or inguinal lymph nodes. Previous left hip arthroplasty. Calcified granulomas identified within the spleen. No hypermetabolic lymph nodes in the abdomen or pelvis. SKELETON No focal hypermetabolic activity to suggest skeletal metastasis. IMPRESSION: 1. Large hypermetabolic mass is identified within the left side of tongue and extending towards the left angle of mandible. 2. Solitary right level 2 lymph node is sub cm but exhibits malignant range activity. 3. No evidence for distant metastatic disease to the chest abdomen or pelvis 4. Aortic atherosclerosis and multi vessel coronary artery disease. 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 Recommend semi-annual imaging followup by CTA or MRA and referral to cardiothoracic surgery if not already obtained. This recommendation follows 2010  ACCF/AHA/AATS/ACR/ASA/SCA/SCAI/SIR/STS/SVM Guidelines for the Diagnosis and Management of Patients With Thoracic Aortic Disease. Circulation. 2010; 121: e266-e36 5. Calcified pleural plaques compatible with prior asbestos exposure. 6. Nonspecific heterogeneous uptake within both lobes of the thyroid gland. Consider further assessment with thyroid sonography. 7. Complicated cyst containing calcifications and adjacent hyperdense kidney lesion noted within the right kidney. Consider further evaluation with renal protocol CT or MRI. Electronically Signed   By: Kerby Moors M.D.   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  PLAN:    1. 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 Patient also has a subcentimeter level II lymph node which is positive on PET that is highly suspicious for disease. After lengthy discussion with the patient, he wishes to proceed with concurrent treatment using Erbitux along with daily XRT. He will return to clinic on Tuesday for infusion education and then in 1 week to initiate cycle 1 of weekly Erbitux. Plan on doing 4-6 treatments and then repeat PET scan in approximately 6-8 weeks after completion of his XRT. Patient also been instructed to keep his regular scheduled ENT appointments.  Approximately 45 minutes was spent in discussion of which greater than 50% was consultation.  Patient expressed understanding and was in agreement with this plan. He also understands that He can call clinic at any time with any questions, concerns, or complaints.    Lloyd Huger, MD   09/24/2015 12:44 PM

## 2015-09-24 NOTE — Progress Notes (Signed)
Pt referred back from Dr. Baruch Gouty for recurrent tongue cancer. States has pain in head and neck. Current pain medication helps relieve pain at this time.

## 2015-09-26 ENCOUNTER — Ambulatory Visit: Payer: Medicare Other | Admitting: Radiation Oncology

## 2015-09-26 ENCOUNTER — Inpatient Hospital Stay: Admission: RE | Admit: 2015-09-26 | Payer: Medicare Other | Source: Ambulatory Visit | Admitting: Radiation Oncology

## 2015-10-01 ENCOUNTER — Telehealth: Payer: Self-pay | Admitting: *Deleted

## 2015-10-01 ENCOUNTER — Inpatient Hospital Stay: Payer: Medicare Other

## 2015-10-01 DIAGNOSIS — Z510 Encounter for antineoplastic radiation therapy: Secondary | ICD-10-CM | POA: Diagnosis not present

## 2015-10-01 NOTE — Telephone Encounter (Signed)
Pt was supposed to have chemo class today.  Did he show?  Keep f/u appt later this week.

## 2015-10-01 NOTE — Patient Instructions (Signed)
Cetuximab injection What is this medicine? CETUXIMAB (se TUX i mab) is a monoclonal antibody. It is used to treat colorectal cancer and head and neck cancer. This medicine may be used for other purposes; ask your health care provider or pharmacist if you have questions. What should I tell my health care provider before I take this medicine? They need to know if you have any of these conditions: -heart disease -history of irregular heartbeat -history of low levels of calcium, magnesium, or potassium in the blood -lung or breathing disease, like asthma -an unusual or allergic reaction to cetuximab, other medicines, foods, dyes, or preservatives -pregnant or trying to get pregnant -breast-feeding How should I use this medicine? This drug is given as an infusion into a vein. It is administered in a hospital or clinic by a specially trained health care professional. Talk to your pediatrician regarding the use of this medicine in children. Special care may be needed. Overdosage: If you think you have taken too much of this medicine contact a poison control center or emergency room at once. NOTE: This medicine is only for you. Do not share this medicine with others. What if I miss a dose? It is important not to miss your dose. Call your doctor or health care professional if you are unable to keep an appointment. What may interact with this medicine? Interactions are not expected. This list may not describe all possible interactions. Give your health care provider a list of all the medicines, herbs, non-prescription drugs, or dietary supplements you use. Also tell them if you smoke, drink alcohol, or use illegal drugs. Some items may interact with your medicine. What should I watch for while using this medicine? Visit your doctor or health care professional for regular checks on your progress. This drug may make you feel generally unwell. This is not uncommon, as chemotherapy can affect healthy cells  as well as cancer cells. Report any side effects. Continue your course of treatment even though you feel ill unless your doctor tells you to stop. This medicine can make you more sensitive to the sun. Keep out of the sun while taking this medicine and for 2 months after the last dose. If you cannot avoid being in the sun, wear protective clothing and use sunscreen. Do not use sun lamps or tanning beds/booths. You may need blood work done while you are taking this medicine. In some cases, you may be given additional medicines to help with side effects. Follow all directions for their use. Call your doctor or health care professional for advice if you get a fever, chills or sore throat, or other symptoms of a cold or flu. Do not treat yourself. This drug decreases your body's ability to fight infections. Try to avoid being around people who are sick. Avoid taking products that contain aspirin, acetaminophen, ibuprofen, naproxen, or ketoprofen unless instructed by your doctor. These medicines may hide a fever. Do not become pregnant while taking this medicine. Women should inform their doctor if they wish to become pregnant or think they might be pregnant. There is a potential for serious side effects to an unborn child. Use adequate birth control methods. Avoid pregnancy for at least 6 months after your last dose. Talk to your health care professional or pharmacist for more information. Do not breast-feed an infant while taking this medicine or during the 2 months after your last dose. What side effects may I notice from receiving this medicine? Side effects that you should report to   your doctor or health care professional as soon as possible: -allergic reactions like skin rash, itching or hives, swelling of the face, lips, or tongue -breathing problems -changes in vision -fast, irregular heartbeat -feeling faint or lightheaded, falls -fever, chills -mouth sores -redness, blistering, peeling or  loosening of the skin, including inside the mouth -trouble passing urine or change in the amount of urine -unusually weak or tired Side effects that usually do not require medical attention (report to your doctor or health care professional if they continue or are bothersome): -changes in skin like acne, cracks, skin dryness -constipation -diarrhea -headache -nail changes -nausea, vomiting -stomach upset -weight loss This list may not describe all possible side effects. Call your doctor for medical advice about side effects. You may report side effects to FDA at 1-800-FDA-1088. Where should I keep my medicine? This drug is given in a hospital or clinic and will not be stored at home. NOTE: This sheet is a summary. It may not cover all possible information. If you have questions about this medicine, talk to your doctor, pharmacist, or health care provider.    2016, Elsevier/Gold Standard. (2014-05-02 22:27:08)  

## 2015-10-01 NOTE — Telephone Encounter (Signed)
Son requested facility call to let MD know that patient is not eating and he is concerned

## 2015-10-01 NOTE — Telephone Encounter (Signed)
Yes he came for class

## 2015-10-02 DIAGNOSIS — Z510 Encounter for antineoplastic radiation therapy: Secondary | ICD-10-CM | POA: Diagnosis not present

## 2015-10-02 DIAGNOSIS — C029 Malignant neoplasm of tongue, unspecified: Secondary | ICD-10-CM | POA: Diagnosis not present

## 2015-10-02 DIAGNOSIS — M6281 Muscle weakness (generalized): Secondary | ICD-10-CM | POA: Diagnosis not present

## 2015-10-03 ENCOUNTER — Inpatient Hospital Stay: Payer: Medicare Other

## 2015-10-03 ENCOUNTER — Ambulatory Visit: Admission: RE | Admit: 2015-10-03 | Payer: Medicare Other | Source: Ambulatory Visit

## 2015-10-03 ENCOUNTER — Ambulatory Visit: Payer: Medicare Other

## 2015-10-03 ENCOUNTER — Inpatient Hospital Stay (HOSPITAL_BASED_OUTPATIENT_CLINIC_OR_DEPARTMENT_OTHER): Payer: Medicare Other | Admitting: Oncology

## 2015-10-03 VITALS — BP 122/75 | HR 80 | Resp 18

## 2015-10-03 VITALS — BP 101/65 | HR 89 | Temp 97.4°F | Resp 18

## 2015-10-03 DIAGNOSIS — Z7982 Long term (current) use of aspirin: Secondary | ICD-10-CM

## 2015-10-03 DIAGNOSIS — Z79899 Other long term (current) drug therapy: Secondary | ICD-10-CM

## 2015-10-03 DIAGNOSIS — Z923 Personal history of irradiation: Secondary | ICD-10-CM

## 2015-10-03 DIAGNOSIS — K219 Gastro-esophageal reflux disease without esophagitis: Secondary | ICD-10-CM

## 2015-10-03 DIAGNOSIS — C01 Malignant neoplasm of base of tongue: Secondary | ICD-10-CM

## 2015-10-03 DIAGNOSIS — R531 Weakness: Secondary | ICD-10-CM | POA: Diagnosis not present

## 2015-10-03 DIAGNOSIS — I10 Essential (primary) hypertension: Secondary | ICD-10-CM

## 2015-10-03 DIAGNOSIS — Z87891 Personal history of nicotine dependence: Secondary | ICD-10-CM

## 2015-10-03 DIAGNOSIS — Z85810 Personal history of malignant neoplasm of tongue: Secondary | ICD-10-CM

## 2015-10-03 DIAGNOSIS — R5383 Other fatigue: Secondary | ICD-10-CM

## 2015-10-03 DIAGNOSIS — Z5112 Encounter for antineoplastic immunotherapy: Secondary | ICD-10-CM | POA: Diagnosis not present

## 2015-10-03 DIAGNOSIS — R630 Anorexia: Secondary | ICD-10-CM

## 2015-10-03 DIAGNOSIS — M1990 Unspecified osteoarthritis, unspecified site: Secondary | ICD-10-CM

## 2015-10-03 DIAGNOSIS — K589 Irritable bowel syndrome without diarrhea: Secondary | ICD-10-CM

## 2015-10-03 DIAGNOSIS — R5381 Other malaise: Secondary | ICD-10-CM

## 2015-10-03 LAB — CBC WITH DIFFERENTIAL/PLATELET
BASOS PCT: 0 %
Basophils Absolute: 0 10*3/uL (ref 0–0.1)
Eosinophils Absolute: 0 10*3/uL (ref 0–0.7)
Eosinophils Relative: 0 %
HEMATOCRIT: 36.3 % — AB (ref 40.0–52.0)
HEMOGLOBIN: 12.4 g/dL — AB (ref 13.0–18.0)
LYMPHS ABS: 0.3 10*3/uL — AB (ref 1.0–3.6)
LYMPHS PCT: 2 %
MCH: 29.3 pg (ref 26.0–34.0)
MCHC: 34.2 g/dL (ref 32.0–36.0)
MCV: 85.8 fL (ref 80.0–100.0)
MONOS PCT: 3 %
Monocytes Absolute: 0.4 10*3/uL (ref 0.2–1.0)
NEUTROS ABS: 13.6 10*3/uL — AB (ref 1.4–6.5)
NEUTROS PCT: 95 %
Platelets: 325 10*3/uL (ref 150–440)
RBC: 4.23 MIL/uL — ABNORMAL LOW (ref 4.40–5.90)
RDW: 13.8 % (ref 11.5–14.5)
WBC: 14.4 10*3/uL — ABNORMAL HIGH (ref 3.8–10.6)

## 2015-10-03 LAB — BASIC METABOLIC PANEL
ANION GAP: 11 (ref 5–15)
BUN: 41 mg/dL — ABNORMAL HIGH (ref 6–20)
CO2: 24 mmol/L (ref 22–32)
Calcium: 8.7 mg/dL — ABNORMAL LOW (ref 8.9–10.3)
Chloride: 108 mmol/L (ref 101–111)
Creatinine, Ser: 1.37 mg/dL — ABNORMAL HIGH (ref 0.61–1.24)
GFR calc non Af Amer: 41 mL/min — ABNORMAL LOW (ref >60)
GFR, EST AFRICAN AMERICAN: 48 mL/min — AB (ref >60)
Glucose, Bld: 159 mg/dL — ABNORMAL HIGH (ref 65–99)
POTASSIUM: 3.6 mmol/L (ref 3.5–5.1)
Sodium: 143 mmol/L (ref 135–145)

## 2015-10-03 LAB — MAGNESIUM: Magnesium: 2.3 mg/dL (ref 1.7–2.4)

## 2015-10-03 MED ORDER — DIPHENHYDRAMINE HCL 50 MG/ML IJ SOLN
25.0000 mg | Freq: Once | INTRAMUSCULAR | Status: AC
Start: 2015-10-03 — End: 2015-10-03
  Administered 2015-10-03: 25 mg via INTRAVENOUS
  Filled 2015-10-03: qty 1

## 2015-10-03 MED ORDER — SODIUM CHLORIDE 0.9 % IV SOLN
Freq: Once | INTRAVENOUS | Status: AC
  Administered 2015-10-03: 12:00:00 via INTRAVENOUS
  Filled 2015-10-03: qty 1000

## 2015-10-03 MED ORDER — CETUXIMAB CHEMO IV INJECTION 200 MG/100ML
400.0000 mg/m2 | Freq: Once | INTRAVENOUS | Status: AC
  Administered 2015-10-03: 800 mg via INTRAVENOUS
  Filled 2015-10-03: qty 400

## 2015-10-03 NOTE — Progress Notes (Signed)
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  INTERVAL HISTORY: Patient returns to clinic today for further evaluation and to initiate concurrent XRT with cetuximab. He has increased weakness and fatigue, but attributes this to a poor appetite and difficulty eating. He otherwise feels well and is asymptomatic. He has no neurologic complaint. He denies any recent fevers or illnesses. He has no chest pain or shortness of breath. He has a fair appetite, but denies weight loss. He has no nausea, vomiting, constipation, or diarrhea. He has no urinary complaints. Patient otherwise feels well and offers no further specific complaints.  REVIEW OF SYSTEMS:   Review of Systems  Constitutional: Positive for malaise/fatigue and weight loss. Negative for fever.  HENT: Negative.  Negative for sore throat.   Respiratory: Negative.  Negative for cough and shortness of breath.   Cardiovascular: Negative.  Negative for chest pain.  Gastrointestinal: Negative.  Negative for abdominal pain.  Genitourinary: Negative.   Musculoskeletal: Negative.   Neurological: Positive for weakness.  Psychiatric/Behavioral: Negative.     As per HPI. Otherwise, a complete review of systems is negatve.  PAST MEDICAL HISTORY: Past Medical History:  Diagnosis Date  . Arthritis   . Cancer [facility])    prostate  . Claustrophobia   . GERD (gastroesophageal reflux disease)   . Hypertension   . IBS (irritable bowel syndrome)   . Involuntary movements    Legs  . Macular degeneration, left eye   . Shortness of breath dyspnea    with exertion    PAST SURGICAL HISTORY: Past Surgical History:  Procedure Laterality Date  . EXCISION OF TONGUE LESION N/A 04/03/2015   Procedure:  EXCISION OF TONGUE LESION, PARTIAL GLOSSECTOMY;  Surgeon: Clyde Canterbury, MD;  Location: [facility] ORS;  Service: ENT;  Laterality: N/A;  . EYE SURGERY Bilateral    Cataract Extraction with IOL  . JOINT REPLACEMENT Left    Total Hip Replacement  . NASAL SEPTUM SURGERY      FAMILY HISTORY: Family History  Problem Relation Age of Onset  . Hypertension Mother   . COPD Sister        ADVANCED DIRECTIVES:    HEALTH MAINTENANCE: Social History  Substance Use Topics  . Smoking status: Former Smoker    Packs/day: 1.00    Types: Cigarettes  . Smokeless tobacco: Never Used     Comment: QUIT 40 YEARS AGO  . Alcohol use No     Colonoscopy:  PAP:  Bone density:  Lipid panel:  Allergies  Allergen Reactions  . Sulfa Antibiotics Nausea And Vomiting    "pain in torso"    Current Outpatient Prescriptions  Medication Sig Dispense Refill  . aspirin EC 81 MG tablet Take by mouth.    . bimatoprost (LUMIGAN) 0.01 % SOLN Place 1 drop into both eyes at bedtime.    . cholestyramine (QUESTRAN) 4 g packet Take 4 g by mouth 3 (three) times daily with meals.    . fluconazole (DIFLUCAN) 150 MG tablet Take 150 mg by mouth daily. For 14 days    . fluticasone (FLONASE) 50 MCG/ACT nasal spray Place into the nose.    . Multiple Vitamin (MULTIVITAMIN) tablet Take 1 tablet by mouth daily.    Marland Kitchen omeprazole (PRILOSEC) 20 MG capsule Take 20 mg by  mouth daily.    . Probiotic Product (ALIGN) 4 MG CAPS Take 1 capsule by mouth daily.    . traMADol (ULTRAM) 50 MG tablet Take by mouth every 8 (eight) hours as needed.     No current facility-administered medications for this visit.     OBJECTIVE: Vitals:   10/03/15 1039  BP: 101/65  Pulse: 89  Resp: 18  Temp: 97.4 F (36.3 C)     There is no height or weight on file to calculate BMI.    ECOG FS:0 - Asymptomatic  General: Well-developed, well-nourished, no acute distress. Eyes: Pink conjunctiva, anicteric sclera. HEENT: Lesion at base of tongue difficult  to visualize. No palpable lymphadenopathy.  Lungs: Clear to auscultation bilaterally. Heart: Regular rate and rhythm. No rubs, murmurs, or gallops. Abdomen: Soft, nontender, nondistended. No organomegaly noted, normoactive bowel sounds. Musculoskeletal: No edema, cyanosis, or clubbing. Neuro: Alert, answering all questions appropriately. Cranial nerves grossly intact. Skin: No rashes or petechiae noted. Psych: Normal affect.   LAB RESULTS:  Lab Results  Component Value Date   NA 143 10/03/2015   K 3.6 10/03/2015   CL 108 10/03/2015   CO2 24 10/03/2015   GLUCOSE 159 (H) 10/03/2015   BUN 41 (H) 10/03/2015   CREATININE 1.37 (H) 10/03/2015   CALCIUM 8.7 (L) 10/03/2015   PROT 7.3 10/03/2012   ALBUMIN 4.2 10/03/2012   AST 18 10/03/2012   ALT 19 10/03/2012   ALKPHOS 83 10/03/2012   BILITOT 0.5 10/03/2012   GFRNONAA 41 (L) 10/03/2015   GFRAA 48 (L) 10/03/2015    Lab Results  Component Value Date   WBC 14.4 (H) 10/03/2015   NEUTROABS 13.6 (H) 10/03/2015   HGB 12.4 (L) 10/03/2015   HCT 36.3 (L) 10/03/2015   MCV 85.8 10/03/2015   PLT 325 10/03/2015     STUDIES: Nm Pet Image Initial (pi) Skull Base To Thigh  Result Date: 09/13/2015 CLINICAL DATA:  Subsequent treatment strategy for head and neck carcinoma. EXAM: NUCLEAR MEDICINE PET SKULL BASE TO THIGH TECHNIQUE: 12.26 mCi F-18 FDG was injected intravenously. Full-ring PET imaging was performed from the skull base to thigh after the radiotracer. CT data was obtained and used for attenuation correction and anatomic localization. FASTING BLOOD GLUCOSE:  Value: 103 mg/dl COMPARISON:  10/10/2012 FINDINGS: NECK There is a large focus of intense radiotracer uptake involving the left side of tongue. This measures 5.5 cm in length, 4.4 cm AP and 4.1 cm transverse. SUV max associated with this mass is equal to 17.48. The tumor appears to extend posteriorly to the level of the angle of jaw. There is a sub cm lymph node within the right level 2  region which exhibits malignant range FDG uptake within SUV max equal to 4.4. Heterogeneous uptake is identified within both lobes of the thyroid gland. CHEST Normal heart size. There is aortic atherosclerosis. The transverse aortic arch measures 5 cm. Calcification involving the LAD coronary artery noted. No hypermetabolic mediastinal or hilar lymph nodes. No axillary or supraclavicular hypermetabolic adenopathy. No pleural fluid. Bilateral calcified pleural plaques are noted compatible with prior asbestos exposure. There is no suspicious pulmonary nodule or mass identified. ABDOMEN/PELVIS No abnormal hypermetabolic activity within the liver, pancreas, adrenal glands, or spleen. There is a cyst within the inferior pole of the right kidney. Calcifications are identified within the wall of the cyst. There is an adjacent hyperdense structure measuring 1.4 cm and 44 HU. No hypermetabolic lymph nodes identified within the upper abdomen. No hypermetabolic pelvic or inguinal  lymph nodes. Previous left hip arthroplasty. Calcified granulomas identified within the spleen. No hypermetabolic lymph nodes in the abdomen or pelvis. SKELETON No focal hypermetabolic activity to suggest skeletal metastasis. IMPRESSION: 1. Large hypermetabolic mass is identified within the left side of tongue and extending towards the left angle of mandible. 2. Solitary right level 2 lymph node is sub cm but exhibits malignant range activity. 3. No evidence for distant metastatic disease to the chest abdomen or pelvis 4. Aortic atherosclerosis and multi vessel coronary artery disease. 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 Recommend semi-annual imaging followup by CTA or MRA and referral to cardiothoracic surgery if not already obtained. This recommendation follows 2010 ACCF/AHA/AATS/ACR/ASA/SCA/SCAI/SIR/STS/SVM Guidelines for the Diagnosis and Management of Patients With Thoracic Aortic Disease. Circulation. 2010; 121:  e266-e36 5. Calcified pleural plaques compatible with prior asbestos exposure. 6. Nonspecific heterogeneous uptake within both lobes of the thyroid gland. Consider further assessment with thyroid sonography. 7. Complicated cyst containing calcifications and adjacent hyperdense kidney lesion noted within the right kidney. Consider further evaluation with renal protocol CT or MRI. Electronically Signed   By: Kerby Moors M.D.   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  PLAN:    1. 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 Patient also has a subcentimeter level II lymph node which is positive on PET that is highly suspicious for disease. Patient initiated XRT today. Given his advanced age, we will use weekly concurrent cetuximab starting today. Return to clinic in 1 week for consideration of cycle 2 of cetuximab. Plan on doing 4-6 treatments and then repeat PET scan in approximately 6-8 weeks after completion of his XRT. Patient also been instructed to keep his regular scheduled ENT appointments. 2. Renal insufficiency: Mild, monitor. 3. Leukocytosis: Likely reactive, monitor.  Patient expressed understanding and was in agreement with this plan. He also understands that He can call clinic at any time with any questions, concerns, or complaints.    Lloyd Huger, MD   10/03/2015 10:54 AM

## 2015-10-03 NOTE — Progress Notes (Signed)
States has decreased appetite, weakness, and occasional pain in the back of right leg.

## 2015-10-04 ENCOUNTER — Telehealth: Payer: Self-pay | Admitting: *Deleted

## 2015-10-04 NOTE — Telephone Encounter (Signed)
Patients son called and spoke with [facility] in radiation this morning. Patient son is requesting a call from Dr. Grayland Ormond to discuss patients plan of care. Patients son states he was only aware of palliative radiation and was not aware his father was receiving chemotherapy/infusion. Patients son lives out of town and cannot attend appointments. Ana discussed with patients son about the chemotherapy/infusion he is currently getting and the fact that it works along with radiation to maximize effects of radiation, patients son is requesting to speak directly to provider for more information. Please call son Rush Landmark at [phone].

## 2015-10-07 ENCOUNTER — Ambulatory Visit: Payer: Medicare Other

## 2015-10-07 ENCOUNTER — Telehealth: Payer: Self-pay

## 2015-10-07 DIAGNOSIS — M545 Low back pain: Secondary | ICD-10-CM

## 2015-10-07 NOTE — Telephone Encounter (Signed)
Patients son called and he want to discontinue both radiation and chemotherapy treatment.  He has contacted patents primary care physician and they are going to start hospice at [facility].

## 2015-10-07 NOTE — Telephone Encounter (Signed)
By report, patient's son has enrolled him in hospice and the PCP has signed the orders.

## 2015-10-08 ENCOUNTER — Ambulatory Visit: Payer: Medicare Other

## 2015-10-09 ENCOUNTER — Ambulatory Visit: Payer: Medicare Other

## 2015-10-09 ENCOUNTER — Inpatient Hospital Stay: Payer: Medicare Other

## 2015-10-10 ENCOUNTER — Ambulatory Visit: Payer: Medicare Other

## 2015-10-10 ENCOUNTER — Inpatient Hospital Stay: Payer: Medicare Other | Admitting: Oncology

## 2015-10-10 ENCOUNTER — Inpatient Hospital Stay: Payer: Medicare Other

## 2015-10-11 ENCOUNTER — Ambulatory Visit: Payer: Medicare Other

## 2015-10-11 ENCOUNTER — Inpatient Hospital Stay: Payer: Medicare Other

## 2015-10-14 ENCOUNTER — Telehealth: Payer: Self-pay | Admitting: *Deleted

## 2015-10-14 ENCOUNTER — Ambulatory Visit: Payer: Medicare Other

## 2015-10-14 ENCOUNTER — Inpatient Hospital Stay: Payer: Medicare Other

## 2015-10-14 NOTE — Telephone Encounter (Signed)
Son Joneen Caraway called to let us know the pt passed away. He wanted to make sure that billing has his address for future bills because pt was a resident of [facility] and the sone Joneen Caraway is the executor of estate and he needs to get the bills.  His [place] [address].  His home phone is: [phone]  [phone].  I told him I will pass this on to Ulice Dash over accounting for cancer center. He was appreciable to the help.

## 2015-10-14 NOTE — Telephone Encounter (Signed)
I entered a phone call note son wants billing to have his address for bills he is over the pt's estate and he lives is [place].  I wrote all info down and sent email to [facility] because I have idea how to get info to billing for future info.

## 2015-10-14 NOTE — Telephone Encounter (Signed)
Joneen Caraway called requesting a call form Dr Mike Gip or Judeen Hammans. [phone]

## 2015-10-15 ENCOUNTER — Inpatient Hospital Stay: Payer: Medicare Other

## 2015-10-15 ENCOUNTER — Ambulatory Visit: Payer: Medicare Other

## 2015-10-16 ENCOUNTER — Inpatient Hospital Stay: Payer: Medicare Other

## 2015-10-16 ENCOUNTER — Ambulatory Visit: Payer: Medicare Other

## 2015-10-17 ENCOUNTER — Inpatient Hospital Stay: Payer: Medicare Other

## 2015-10-17 ENCOUNTER — Ambulatory Visit: Payer: Medicare Other

## 2015-10-18 ENCOUNTER — Inpatient Hospital Stay: Payer: Medicare Other

## 2015-10-18 ENCOUNTER — Ambulatory Visit: Payer: Medicare Other

## 2015-10-21 ENCOUNTER — Inpatient Hospital Stay: Payer: Medicare Other

## 2015-10-21 ENCOUNTER — Ambulatory Visit: Payer: Medicare Other

## 2015-10-22 ENCOUNTER — Inpatient Hospital Stay: Payer: Medicare Other

## 2015-10-22 ENCOUNTER — Ambulatory Visit: Payer: Medicare Other

## 2015-10-23 ENCOUNTER — Ambulatory Visit: Payer: Medicare Other

## 2015-10-23 ENCOUNTER — Inpatient Hospital Stay: Payer: Medicare Other

## 2015-10-24 ENCOUNTER — Ambulatory Visit: Payer: Medicare Other

## 2015-10-24 ENCOUNTER — Inpatient Hospital Stay: Payer: Medicare Other

## 2015-10-25 ENCOUNTER — Inpatient Hospital Stay: Payer: Medicare Other

## 2015-10-25 ENCOUNTER — Ambulatory Visit: Payer: Medicare Other

## 2015-10-28 ENCOUNTER — Inpatient Hospital Stay: Payer: Medicare Other

## 2015-10-28 ENCOUNTER — Ambulatory Visit: Payer: Medicare Other

## 2015-10-29 ENCOUNTER — Ambulatory Visit: Payer: Medicare Other

## 2015-10-29 ENCOUNTER — Inpatient Hospital Stay: Payer: Medicare Other

## 2015-10-30 ENCOUNTER — Ambulatory Visit: Payer: Medicare Other

## 2015-10-30 ENCOUNTER — Inpatient Hospital Stay: Payer: Medicare Other

## 2015-10-31 ENCOUNTER — Inpatient Hospital Stay: Payer: Medicare Other

## 2015-10-31 ENCOUNTER — Ambulatory Visit: Payer: Medicare Other

## 2015-11-01 ENCOUNTER — Ambulatory Visit: Payer: Medicare Other

## 2015-11-01 ENCOUNTER — Inpatient Hospital Stay: Payer: Medicare Other

## 2015-11-04 ENCOUNTER — Encounter (INDEPENDENT_AMBULATORY_CARE_PROVIDER_SITE_OTHER): Payer: Medicare Other | Admitting: Ophthalmology

## 2015-11-04 ENCOUNTER — Ambulatory Visit: Payer: Medicare Other

## 2015-11-04 ENCOUNTER — Inpatient Hospital Stay: Payer: Medicare Other

## 2015-11-05 ENCOUNTER — Ambulatory Visit: Payer: Medicare Other

## 2015-11-05 ENCOUNTER — Inpatient Hospital Stay: Payer: Medicare Other

## 2015-11-06 ENCOUNTER — Ambulatory Visit: Payer: Medicare Other

## 2015-11-06 ENCOUNTER — Inpatient Hospital Stay: Payer: Medicare Other

## 2015-11-07 ENCOUNTER — Ambulatory Visit: Payer: Medicare Other

## 2015-11-07 ENCOUNTER — Inpatient Hospital Stay: Payer: Medicare Other

## 2015-11-08 ENCOUNTER — Inpatient Hospital Stay: Payer: Medicare Other

## 2015-11-08 ENCOUNTER — Ambulatory Visit: Payer: Medicare Other

## 2015-11-08 DEATH — deceased

## 2015-11-11 ENCOUNTER — Inpatient Hospital Stay: Payer: Medicare Other

## 2015-11-12 ENCOUNTER — Ambulatory Visit: Payer: Medicare Other

## 2015-11-12 ENCOUNTER — Inpatient Hospital Stay: Payer: Medicare Other

## 2015-11-13 ENCOUNTER — Ambulatory Visit: Payer: Medicare Other

## 2015-11-13 ENCOUNTER — Inpatient Hospital Stay: Payer: Medicare Other

## 2015-11-14 ENCOUNTER — Ambulatory Visit: Payer: Medicare Other

## 2015-11-14 ENCOUNTER — Inpatient Hospital Stay: Payer: Medicare Other

## 2015-11-15 ENCOUNTER — Inpatient Hospital Stay: Payer: Medicare Other

## 2015-11-15 ENCOUNTER — Ambulatory Visit: Payer: Medicare Other

## 2015-11-18 ENCOUNTER — Ambulatory Visit: Payer: Medicare Other

## 2015-11-18 ENCOUNTER — Inpatient Hospital Stay: Payer: Medicare Other

## 2015-11-19 ENCOUNTER — Ambulatory Visit: Payer: Medicare Other | Admitting: Sports Medicine

## 2015-11-19 ENCOUNTER — Ambulatory Visit: Payer: Medicare Other

## 2015-11-20 ENCOUNTER — Ambulatory Visit: Payer: Medicare Other

## 2015-11-21 ENCOUNTER — Ambulatory Visit: Payer: Medicare Other

## 2016-02-15 ENCOUNTER — Other Ambulatory Visit: Payer: Self-pay | Admitting: Nurse Practitioner

## 2016-12-20 IMAGING — PT NM PET TUM IMG INITIAL (PI) SKULL BASE T - THIGH
1 of 9 series · 1 of 25 positions shown · non-contrast
Comparison: 10/10/2012

CLINICAL DATA: Subsequent treatment strategy for head and neck
carcinoma.

EXAM:
NUCLEAR MEDICINE PET SKULL BASE TO THIGH
TECHNIQUE: 12.26 mCi F-18 FDG was injected intravenously. Full-ring PET imaging
was performed from the skull base to thigh after the radiotracer. CT
data was obtained and used for attenuation correction and anatomic
localization.
FASTING BLOOD GLUCOSE:  Value: 103 mg/dl

[Series 3: ct wb 5.0 b30f · axial · 5.0mm · 0.98mm/px · 1 of 329 slices shown]
[im 329/329  brain]
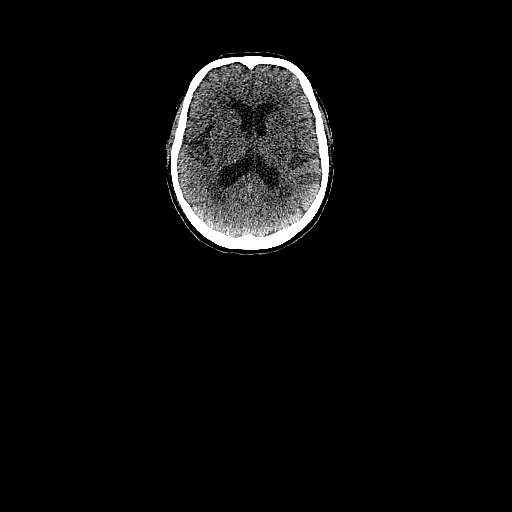

[1 of 25 positions shown; findings below may reference images not displayed]

FINDINGS: NECK

There is a large focus of intense radiotracer uptake involving the
left side of tongue. This measures 5.5 cm in length, 4.4 cm AP and
4.1 cm transverse. SUV max associated with this mass is equal to
17.48. The tumor appears to extend posteriorly to the level of the
angle of jaw. There is a sub cm lymph node within the right level 2
region which exhibits malignant range FDG uptake within SUV max
equal to 4.4. Heterogeneous uptake is identified within both lobes
of the thyroid gland.

CHEST

Normal heart size. There is aortic atherosclerosis. The transverse
aortic arch measures 5 cm. Calcification involving the LAD coronary
artery noted. No hypermetabolic mediastinal or hilar lymph nodes. No
axillary or supraclavicular hypermetabolic adenopathy.

No pleural fluid. Bilateral calcified pleural plaques are noted
compatible with prior asbestos exposure. There is no suspicious
pulmonary nodule or mass identified.

ABDOMEN/PELVIS

No abnormal hypermetabolic activity within the liver, pancreas,
adrenal glands, or spleen. There is a cyst within the inferior pole
of the right kidney. Calcifications are identified within the wall
of the cyst. There is an adjacent hyperdense structure measuring
cm and 44 HU. No hypermetabolic lymph nodes identified within the
upper abdomen. No hypermetabolic pelvic or inguinal lymph nodes.
Previous left hip arthroplasty. Calcified granulomas identified
within the spleen. No hypermetabolic lymph nodes in the abdomen or
pelvis.

SKELETON

No focal hypermetabolic activity to suggest skeletal metastasis.
IMPRESSION: 1. Large hypermetabolic mass is identified within the left side of
tongue and extending towards the left angle of mandible.
2. Solitary right level 2 lymph node is sub cm but exhibits
malignant range activity.
3. No evidence for distant metastatic disease to the chest abdomen
or pelvis
4. Aortic atherosclerosis and multi vessel coronary artery disease.
There is aneurysmal dilatation of the transverse aortic arch.
Recommend semi-annual imaging followup by CTA or MRA and referral to
cardiothoracic surgery if not already obtained. This recommendation
follows 8838 ACCF/AHA/AATS/ACR/ASA/SCA/POCHO/BATYRBAEV/TIU/BHEBHE Guidelines
for the Diagnosis and Management of Patients With Thoracic Aortic
Disease. Circulation. 8838; 121: [id number]
5. Calcified pleural plaques compatible with prior asbestos
exposure.
6. Nonspecific heterogeneous uptake within both lobes of the thyroid
gland. Consider further assessment with thyroid sonography.
7. Complicated cyst containing calcifications and adjacent
hyperdense kidney lesion noted within the right kidney. Consider
further evaluation with renal protocol CT or MRI.
